# Patient Record
Sex: Male | Born: 1965 | Race: White | Hispanic: No | Marital: Married
Health system: Midwestern US, Academic
[De-identification: ages and names within clinical notes are randomized; demographics above are authoritative.]

---

## 2020-07-22 IMAGING — US ECHOCOMPL
1 series · 14 of 24 positions shown · non-contrast
Comparison: none

[Series 1: us echo 2d, complete · 98 acquisitions, 14 frames shown]
[im 1/98]
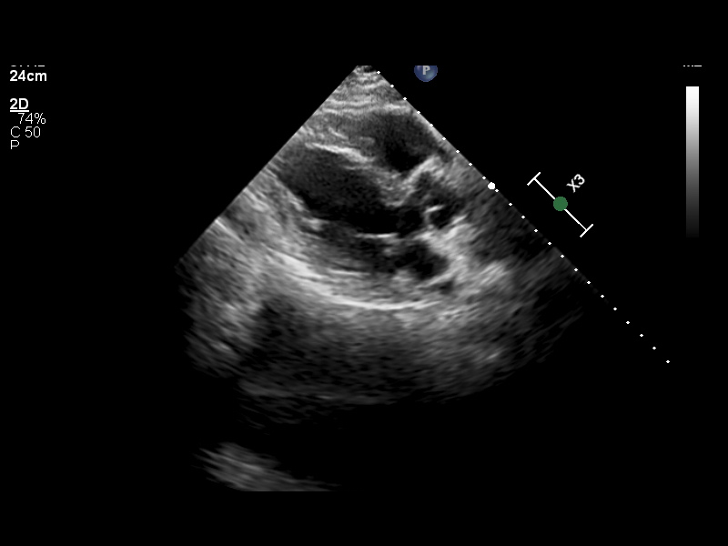
[im 9/98]
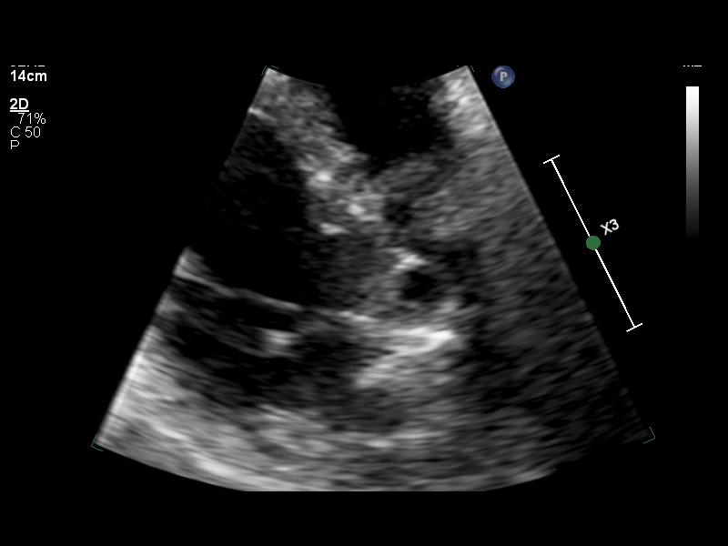
[im 13/98]
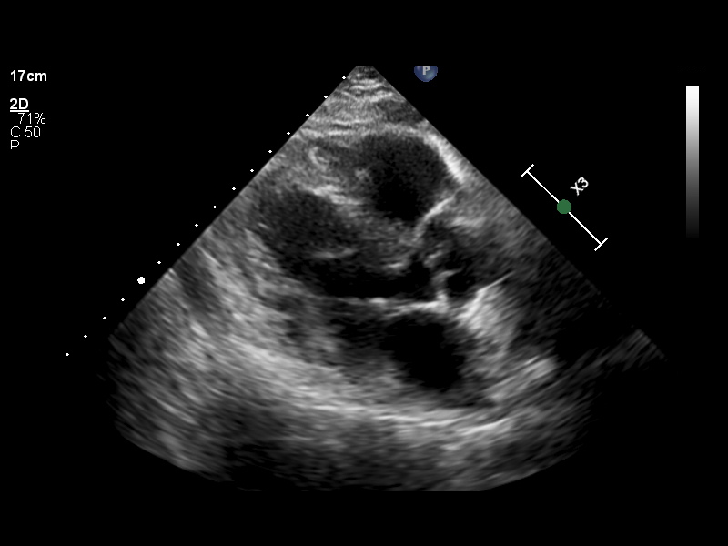
[im 22/98]
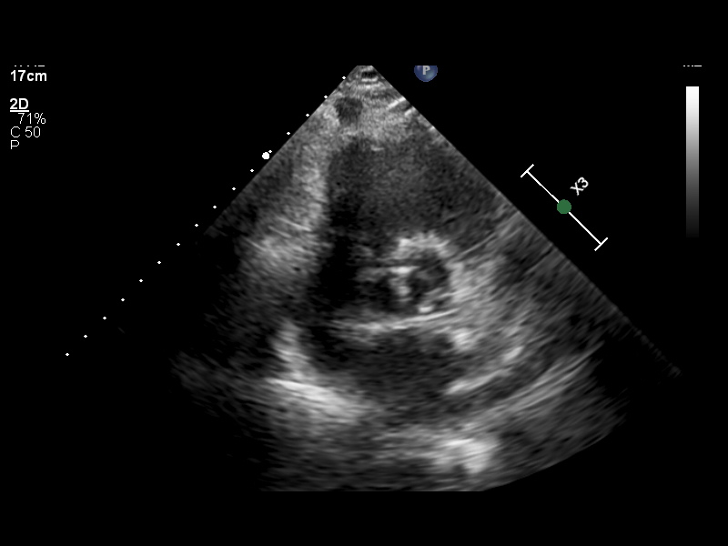
[im 26/98]
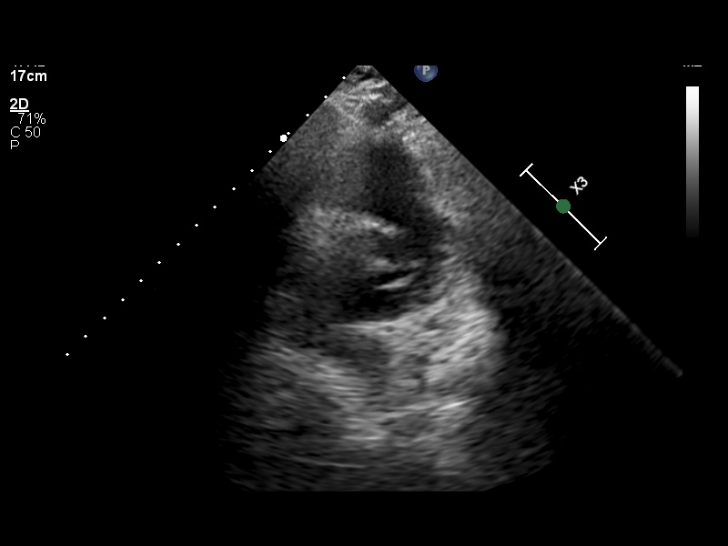
[im 34/98]
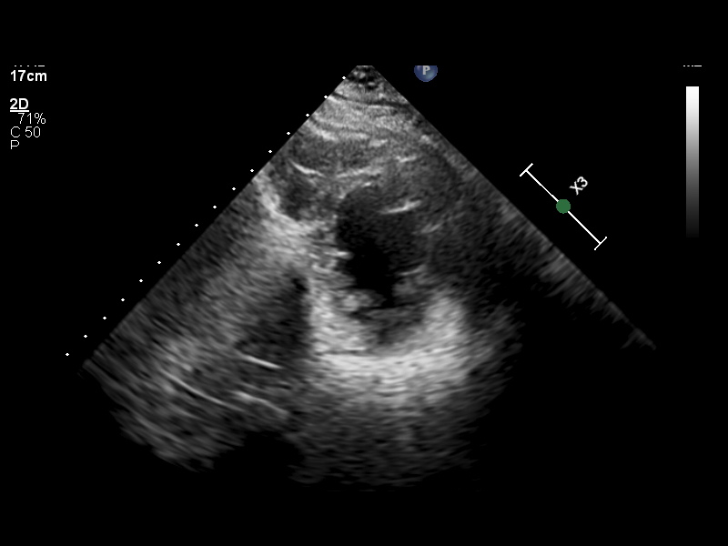
[im 43/98]
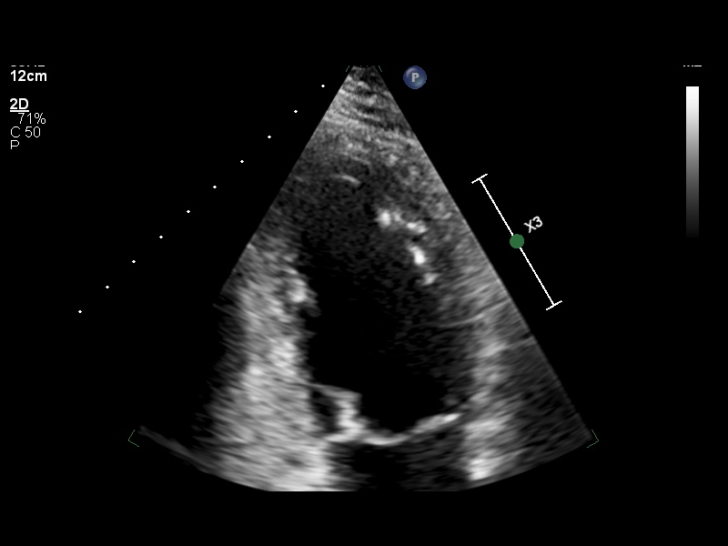
[im 43/98]
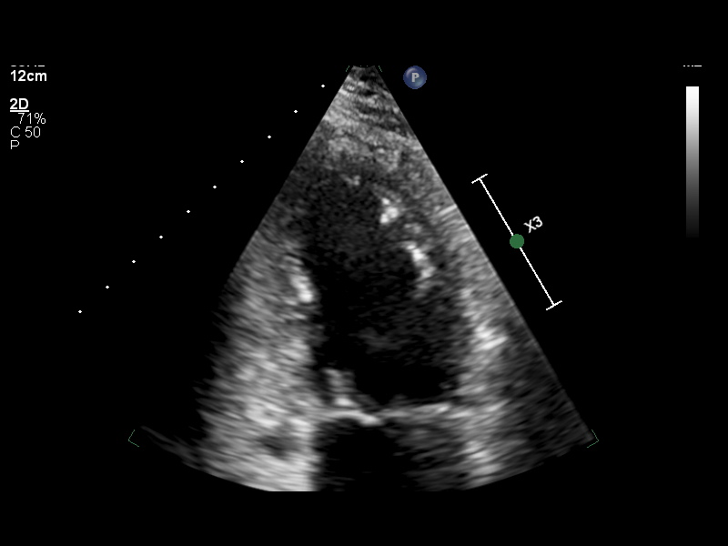
[im 60/98]
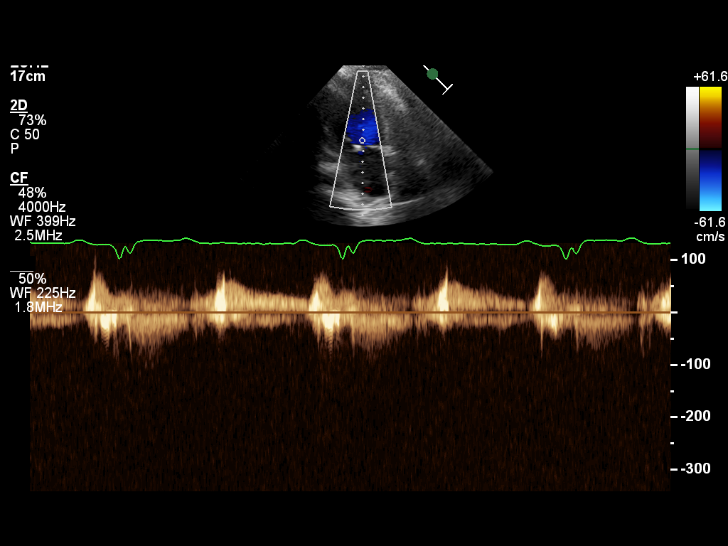
[im 72/98]
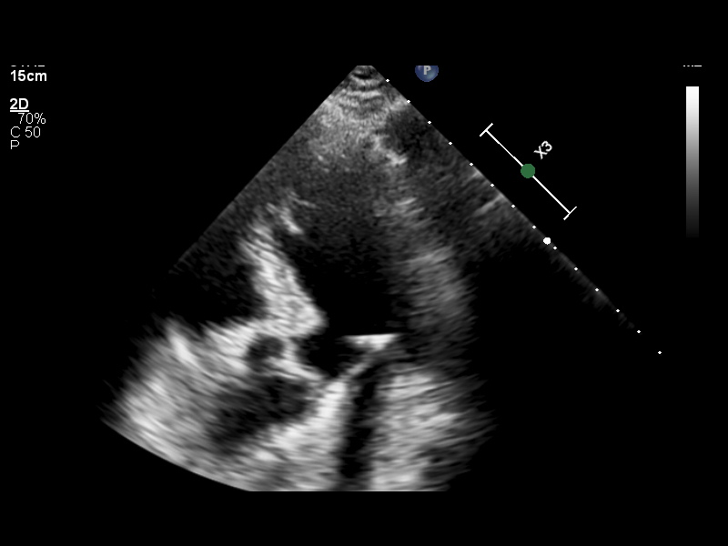
[im 81/98]
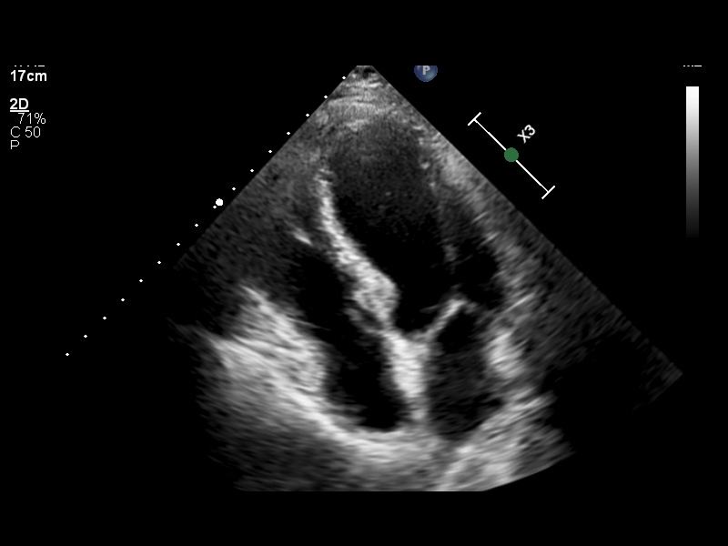
[im 85/98]
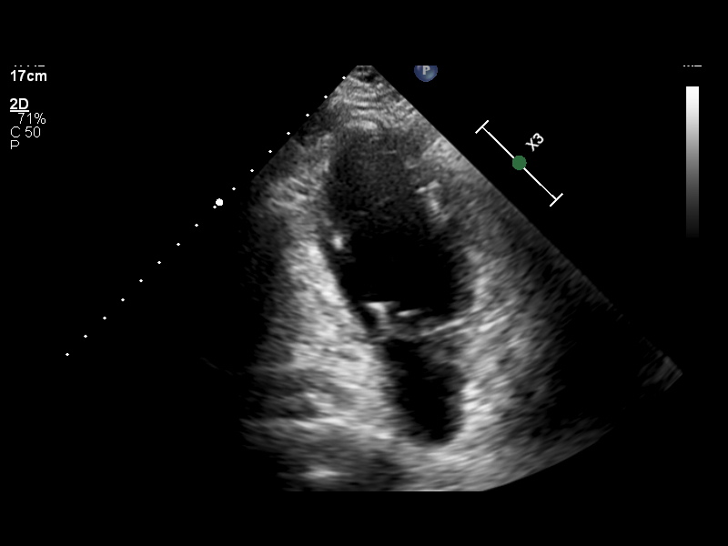
[im 89/98]
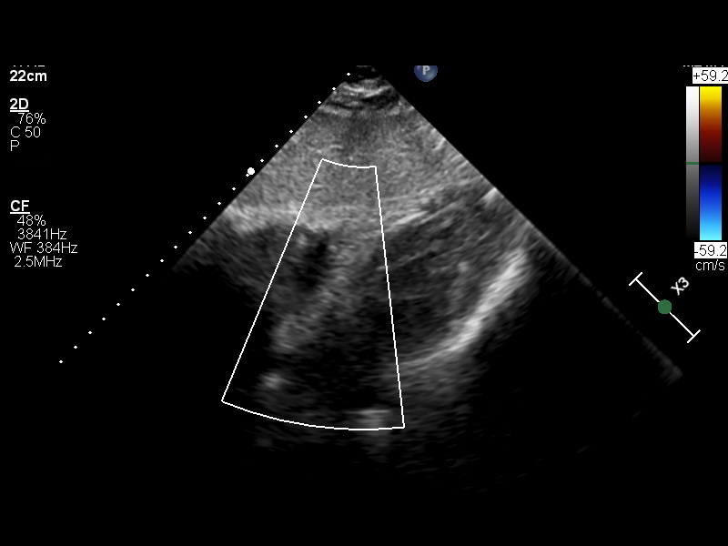
[im 98/98]
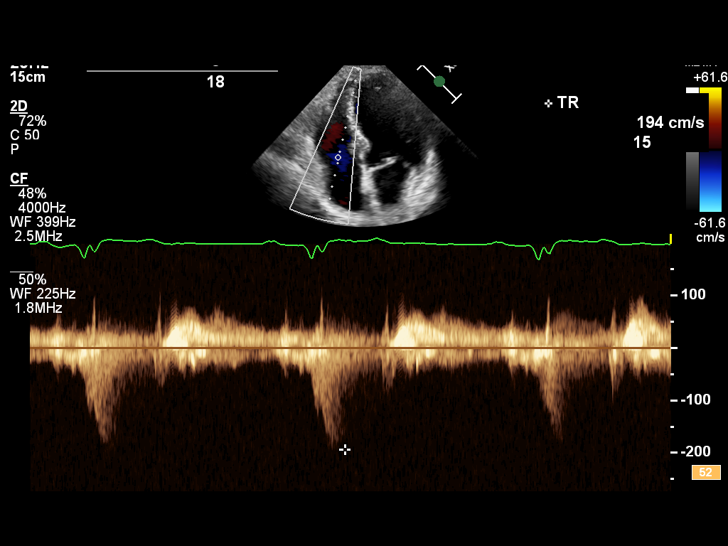

[14 of 24 positions shown; findings below may reference images not displayed]

FINAL REPORT IS SCANNED IN THE PATIENT'S EMR.

Tech Notes:

## 2021-01-11 ENCOUNTER — Encounter: Admit: 2021-01-11 | Discharge: 2021-01-11

## 2021-01-12 NOTE — Progress Notes
Patient Information:  55 yo male presents to ED following dog bit with injuries to forearm and hand. Dog known to patient.  Left FA full thickness laceration 10cm.  No arterial involvement.  Right hand full thickness laceration 8cm.  Muscle/tissue visible.   Left penna laceration 2 cm.    Reason for transfer:  No plastics, ortho or trauma    Imaging:  Right hand-WNL  Left forearm-WNL    EKG:  Right bundle/left fascicluar block    Labs:  Pending    Medications: 1 liter NS    Vitals:  Temp   HR 76  BP 102/50  RR 16  SpO2 99  FiO2 RA  Mental Status A/O x 4    Past Medical History: DM, CVA, CAD    1933 Update:    FA-mid dorsal aspect, baseline sensation, weak grip-pt endorses as weak    Cannot flex wrist d/t pain  Right had-injury across dorsum of hand, extending from mid metacarpal angles to MCP of 2nd finger    Palmar aspect-beneath MCP full thickness, across and down towards to thenar aspect    Punctures-PIP, DIP on second finger    ROM at baseline    Imaging okay.    2009 Update:    Per Dr Jimmye Norman:  With vascular intact would not perform any emergent intervention.  Recommendations:  Thorough (mulitple liters NS) washout of wounds  Close all lacerations/suture loosely, soft dry dressings  Do not close puncture wounds, soft dry dressings  If no PCN allergy, Augmentin for 10 days  Alternative if PCN allergy, Clindamycin   Follow up in clinic-clinic staff to call to set up appointment on Monday.  Clinic [phone]

## 2021-01-14 ENCOUNTER — Encounter: Admit: 2021-01-14 | Discharge: 2021-01-14 | Payer: MEDICARE

## 2021-01-15 ENCOUNTER — Encounter: Admit: 2021-01-15 | Discharge: 2021-01-15 | Payer: MEDICARE

## 2021-01-15 ENCOUNTER — Ambulatory Visit: Admit: 2021-01-15 | Discharge: 2021-01-16 | Payer: MEDICARE

## 2021-01-15 DIAGNOSIS — W540XXA Bitten by dog, initial encounter: Secondary | ICD-10-CM

## 2021-01-15 NOTE — Progress Notes
Subjective:       This is a very pleasant 55 year old gentleman who had a dog bite wounds to his right hand and left forearm on 3/19.  He was seen in the ED at [facility].  He is here for follow-up.    Brandon Conley is a 55 y.o. male.       Review of Systems   Constitutional: Negative.    HENT: Negative.    Eyes: Negative.    Respiratory: Negative.    Cardiovascular: Negative.    Gastrointestinal: Negative.    Endocrine: Negative.    Genitourinary: Negative.    Musculoskeletal: Negative.    Skin: Negative.    Allergic/Immunologic: Negative.    Neurological: Negative.    Hematological: Negative.    Psychiatric/Behavioral: Negative.          Objective:         No current outpatient medications on file.     There were no vitals filed for this visit.  There is no height or weight on file to calculate BMI.     Physical Exam  Physical Exam   Constitutional: Patient is oriented to person, place, and time. Patient appears well-developed and well-nourished.   HENT:   Head: Normocephalic and atraumatic.   Eyes: EOM are normal. Pupils are equal, round, and reactive to light.   Neck: Normal range of motion.   Cardiovascular: Normal rate, regular rhythm, normal heart sounds and intact distal pulses.    Pulmonary/Chest: Effort normal and breath sounds normal.   Neurological: Patient is alert and oriented to person, place, and time.   Skin: Skin is warm and dry. Capillary refill takes less than 2 seconds.   Psychiatric: Patient has a normal mood and affect. Behavior is normal. Judgment and thought content normal.     Dorsal forearm laceration noted, normal sensation in superficial radial nerve distribution, finger/thumb/wrist extensors work the same as preinjury, per patient    Right hand, multiple lacerations over dorsum and volar aspect, some range of motion with gentle work, sensation appears to be intact       Assessment and Plan:  Dog bite injury to left forearm and right hand  Referred to therapy to work on range of motion, follow-up next week

## 2021-01-22 ENCOUNTER — Encounter: Admit: 2021-01-22 | Discharge: 2021-01-22 | Payer: MEDICARE

## 2021-01-22 ENCOUNTER — Ambulatory Visit: Admit: 2021-01-22 | Discharge: 2021-01-23 | Payer: MEDICARE

## 2021-01-22 DIAGNOSIS — W540XXA Bitten by dog, initial encounter: Secondary | ICD-10-CM

## 2021-01-22 NOTE — Progress Notes
Subjective:       Here to follow-up.  Overall doing much better.  Notes that his right hand is still stiff, but he has not been to therapy yet.    Brandon Conley is a 55 y.o. male.       Review of Systems   Constitutional: Negative.    HENT: Negative.    Eyes: Negative.    Respiratory: Negative.    Cardiovascular: Negative.    Gastrointestinal: Negative.    Endocrine: Negative.    Genitourinary: Negative.    Musculoskeletal: Negative.    Skin: Negative.    Allergic/Immunologic: Negative.    Neurological: Negative.    Hematological: Negative.    Psychiatric/Behavioral: Negative.          Objective:         ? aspirin EC (ASPIR-81) 81 mg tablet Take 81 mg by mouth.   ? clopiDOGrel (PLAVIX) 75 mg tablet Take 75 mg by mouth daily.   ? ENTRESTO 24-26 mg tablet Take 1 tablet by mouth twice daily.   ? escitalopram oxalate (LEXAPRO) 10 mg tablet Take 10 mg by mouth daily.   ? furosemide (LASIX) 20 mg tablet Take 20 mg by mouth every morning.   ? HUMALOG U-100 INSULIN 100 unit/mL injection inject 20 UNITS SUBCUTANEOUSLY DAILY   ? HYDROcodone/acetaminophen (NORCO) 5/325 mg tablet Take 1 tablet by mouth.   ? isosorbide mononitrate ER (IMDUR) 30 mg tablet Take 30 mg by mouth daily.   ? LANTUS U-100 INSULIN 100 unit/mL vial inject 30 UNITS SUBCUTANEOUSLY EVERY EVENING   ? metoprolol XL (TOPROL XL) 25 mg extended release tablet Take 25 mg by mouth daily.   ? simvastatin (ZOCOR) 40 mg tablet   90 EA, TAKE ONE TABLET BY MOUTH EVERY EVENING, 0 Number of Refills     There were no vitals filed for this visit.  There is no height or weight on file to calculate BMI.     Physical Exam  Right hand, multiple lacerations with some scabbing, suture starting to fall out.  Limited range of motion appears to be related to a fair bit of dorsal hand swelling.    Left forearm, laceration healing well, sutures starting to fall out       Assessment and Plan:  Bilateral upper extremity dog bites  Sutures removed, patient given instructions to follow-up with therapy.  Follow-up with me in 4 to 6 weeks.

## 2021-01-23 ENCOUNTER — Encounter: Admit: 2021-01-23 | Discharge: 2021-01-23 | Payer: MEDICARE

## 2021-01-23 NOTE — Telephone Encounter
Patient called regarding need for referral for CHT to be faxed to [facility] physical therapy at [phone].    Order printed and faxed.

## 2021-02-26 ENCOUNTER — Ambulatory Visit: Admit: 2021-02-26 | Discharge: 2021-02-27 | Payer: MEDICARE

## 2021-02-26 ENCOUNTER — Encounter: Admit: 2021-02-26 | Discharge: 2021-02-26 | Payer: MEDICARE

## 2021-02-26 DIAGNOSIS — W540XXD Bitten by dog, subsequent encounter: Secondary | ICD-10-CM

## 2021-02-26 NOTE — Progress Notes
Subjective:       History of Present Illness  Brandon Conley is a 55 y.o. male who returns to clinic regarding his right hand. He is still complaining IF/LF stiffness, largely at the MP joint. He goes to hand therapy twice per week. He tried compression but didn't think it was effective and has since stopped.        Review of Systems   Constitutional: Negative.    HENT: Negative.    Eyes: Negative.    Respiratory: Negative.    Cardiovascular: Negative.    Gastrointestinal: Negative.    Endocrine: Negative.    Genitourinary: Negative.    Musculoskeletal: Negative.    Skin: Negative.    Allergic/Immunologic: Negative.    Neurological: Negative.    Hematological: Negative.    Psychiatric/Behavioral: Negative.          Objective:         ? aspirin EC (ASPIR-81) 81 mg tablet Take 81 mg by mouth.   ? clopiDOGrel (PLAVIX) 75 mg tablet Take 75 mg by mouth daily.   ? ENTRESTO 24-26 mg tablet Take 1 tablet by mouth twice daily.   ? escitalopram oxalate (LEXAPRO) 10 mg tablet Take 10 mg by mouth daily.   ? furosemide (LASIX) 20 mg tablet Take 20 mg by mouth every morning.   ? HUMALOG U-100 INSULIN 100 unit/mL injection inject 20 UNITS SUBCUTANEOUSLY DAILY   ? isosorbide mononitrate ER (IMDUR) 30 mg tablet Take 30 mg by mouth daily.   ? LANTUS U-100 INSULIN 100 unit/mL vial inject 30 UNITS SUBCUTANEOUSLY EVERY EVENING   ? metoprolol XL (TOPROL XL) 25 mg extended release tablet Take 25 mg by mouth daily.   ? simvastatin (ZOCOR) 40 mg tablet   90 EA, TAKE ONE TABLET BY MOUTH EVERY EVENING, 0 Number of Refills     Vitals:    02/26/21 0929   BP: 131/55   BP Source: Arm, Right Upper   Pulse: 72   Temp: 36.7 ?C (98 ?F)   TempSrc: Skin   PainSc: Four   Weight: 76.2 kg (168 lb)   Height: 177.8 cm (5' 10)     Body mass index is 24.11 kg/m?Marland Kitchen     Physical Exam  Stiffness of R IF and LF, more so w/ LF. Continues to have swelling around MP joints. Puncture wounds are largely healed. No drainage. IP joints appear supple.       Assessment and Plan:  Appears to be making progress. Continue with therapy and home exercises. RTC 6 weeks.

## 2021-04-02 ENCOUNTER — Encounter: Admit: 2021-04-02 | Discharge: 2021-04-02 | Payer: MEDICARE

## 2021-04-02 ENCOUNTER — Ambulatory Visit: Admit: 2021-04-02 | Discharge: 2021-04-03 | Payer: MEDICARE

## 2021-04-02 DIAGNOSIS — W540XXD Bitten by dog, subsequent encounter: Secondary | ICD-10-CM

## 2021-04-02 NOTE — Progress Notes
Subjective:       Brandon Conley is here to follow-up.  Overall he is doing well, but he is pretty frustrated by continued swelling and difficulty getting range of motion especially in his right middle finger    Brandon Conley is a 55 y.o. male.       Review of Systems   Constitutional: Negative.    HENT: Negative.    Eyes: Negative.    Respiratory: Negative.    Cardiovascular: Negative.    Gastrointestinal: Negative.    Endocrine: Negative.    Genitourinary: Negative.    Musculoskeletal: Negative.    Skin: Negative.    Allergic/Immunologic: Negative.    Neurological: Negative.    Hematological: Negative.    Psychiatric/Behavioral: Negative.          Objective:         ? aspirin EC 81 mg tablet Take 81 mg by mouth.   ? clopiDOGrel (PLAVIX) 75 mg tablet Take 75 mg by mouth daily.   ? ENTRESTO 24-26 mg tablet Take 1 tablet by mouth twice daily.   ? escitalopram oxalate (LEXAPRO) 10 mg tablet Take 10 mg by mouth daily.   ? furosemide (LASIX) 20 mg tablet Take 20 mg by mouth every morning.   ? HUMALOG U-100 INSULIN 100 unit/mL injection inject 20 UNITS SUBCUTANEOUSLY DAILY   ? isosorbide mononitrate ER (IMDUR) 30 mg tablet Take 30 mg by mouth daily.   ? LANTUS U-100 INSULIN 100 unit/mL vial inject 30 UNITS SUBCUTANEOUSLY EVERY EVENING   ? metoprolol XL (TOPROL XL) 25 mg extended release tablet Take 25 mg by mouth daily.   ? simvastatin (ZOCOR) 40 mg tablet   90 EA, TAKE ONE TABLET BY MOUTH EVERY EVENING, 0 Number of Refills     Vitals:    04/02/21 0949   BP: 123/54   Pulse: 59   PainSc: Zero   Weight: 77.5 kg (170 lb 12.8 oz)   Height: 177.8 cm (5' 10)     Body mass index is 24.51 kg/m?Marland Kitchen     Physical Exam  Right hand still with some swelling, active range of motion improving, middle finger still fairly stiff       Assessment and Plan:  Right hand with swelling and stiffness after dog bite injury  Reassured patient that swelling and stiffness at this point are still fairly normal.  Keep working in therapy on active and passive range of motion.  Follow-up in about 2 months.  Might require tenolysis/joint capsulotomy at some point, but hopefully we can avoid that.

## 2021-06-04 ENCOUNTER — Encounter: Admit: 2021-06-04 | Discharge: 2021-06-04 | Payer: MEDICARE

## 2021-06-04 ENCOUNTER — Ambulatory Visit: Admit: 2021-06-04 | Discharge: 2021-06-04 | Payer: MEDICARE

## 2021-06-04 DIAGNOSIS — W540XXD Bitten by dog, subsequent encounter: Secondary | ICD-10-CM

## 2021-06-04 NOTE — Progress Notes
Subjective:       Here to follow-up.  Overall doing well.  Thinks that he is reached his baseline.    Brandon Conley is a 55 y.o. male.       Review of Systems   Constitutional: Negative.    HENT: Negative.    Eyes: Negative.    Respiratory: Negative.    Cardiovascular: Negative.    Gastrointestinal: Negative.    Endocrine: Negative.    Genitourinary: Negative.    Musculoskeletal: Positive for back pain.   Skin: Negative.    Allergic/Immunologic: Negative.    Neurological: Negative.    Hematological: Negative.    Psychiatric/Behavioral: Negative.          Objective:         ? aspirin EC 81 mg tablet Take 81 mg by mouth.   ? clopiDOGrel (PLAVIX) 75 mg tablet Take 75 mg by mouth daily.   ? ENTRESTO 24-26 mg tablet Take 1 tablet by mouth twice daily.   ? escitalopram oxalate (LEXAPRO) 10 mg tablet Take 10 mg by mouth daily.   ? furosemide (LASIX) 20 mg tablet Take 20 mg by mouth every morning.   ? HUMALOG U-100 INSULIN 100 unit/mL injection inject 20 UNITS SUBCUTANEOUSLY DAILY   ? isosorbide mononitrate ER (IMDUR) 30 mg tablet Take 30 mg by mouth daily.   ? LANTUS U-100 INSULIN 100 unit/mL vial inject 30 UNITS SUBCUTANEOUSLY EVERY EVENING   ? metoprolol XL (TOPROL XL) 25 mg extended release tablet Take 25 mg by mouth daily.   ? rOPINIRole (REQUIP) 1 mg tablet TAKE ONE TABLET BY MOUTH 1-3 hours before bedtime   ? simvastatin (ZOCOR) 40 mg tablet   90 EA, TAKE ONE TABLET BY MOUTH EVERY EVENING, 0 Number of Refills     Vitals:    06/04/21 0941   BP: 104/69   Pulse: 60   PainSc: Zero   Weight: 79.4 kg (175 lb)   Height: 177.8 cm (5' 10)     Body mass index is 25.11 kg/m?Marland Kitchen     Physical Exam  Right middle finger with 30 degree flexion contracture, fair bit of stiffness at PIP joint, about 40 degrees of total arc       Assessment and Plan:  Right hand stiffness after dog bite injury  Today I told the patient that if he wants to improve upon things it would essentially be at flexor tenolysis and a volar plate release.  At this point he is not sure that he wants anything further like that.  I told him that if he wants to discuss it more we are happy to see him anytime.    Follow-up as needed.

## 2021-12-25 IMAGING — CR HIPPELBI
3 series · 3 of 3 positions shown · non-contrast
Comparison: none

[hip ap pelvis]
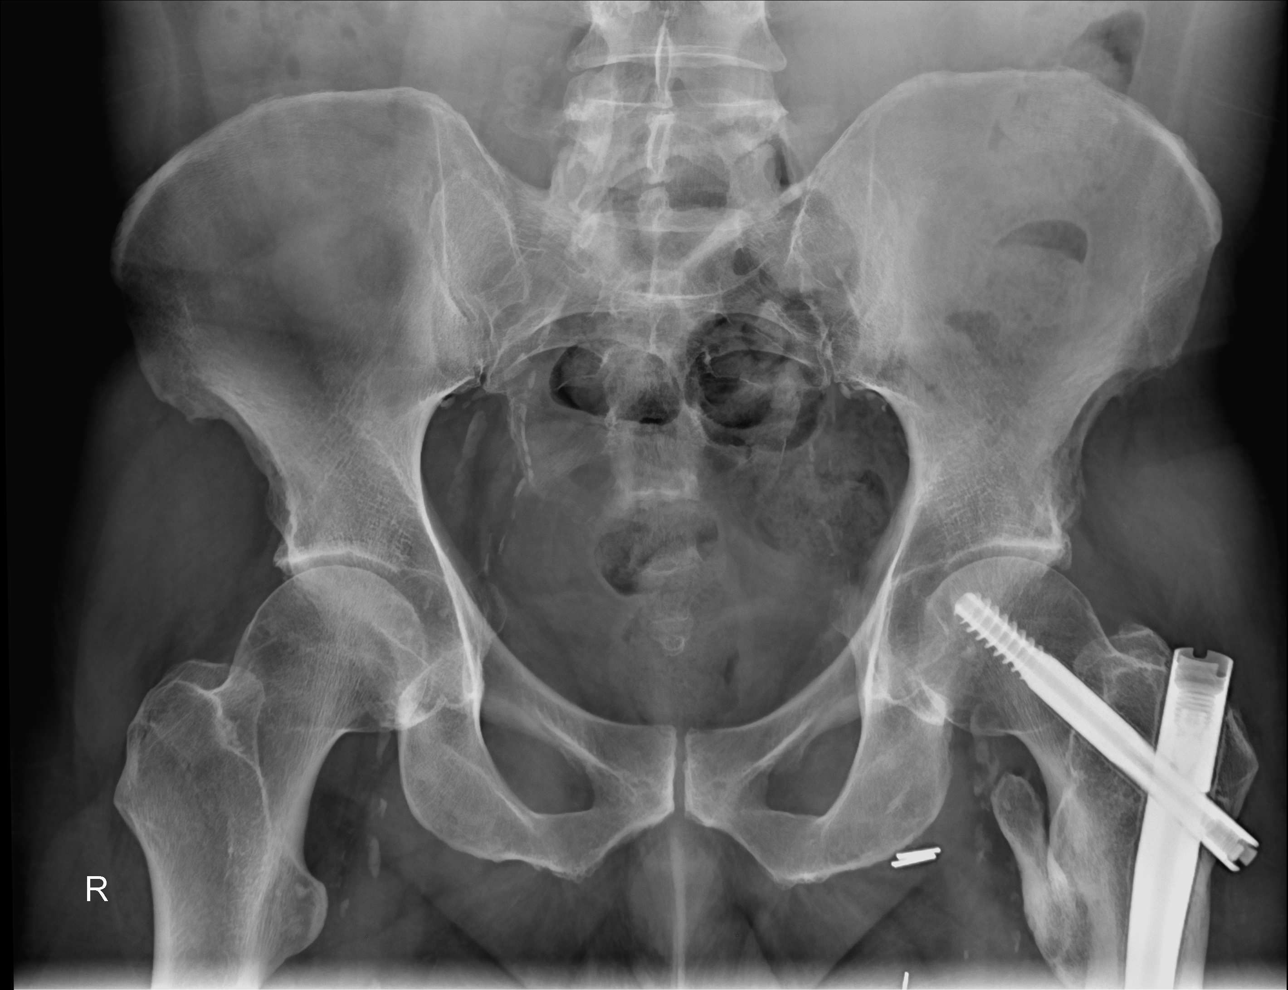

[hip frog lat (1 of 2)]
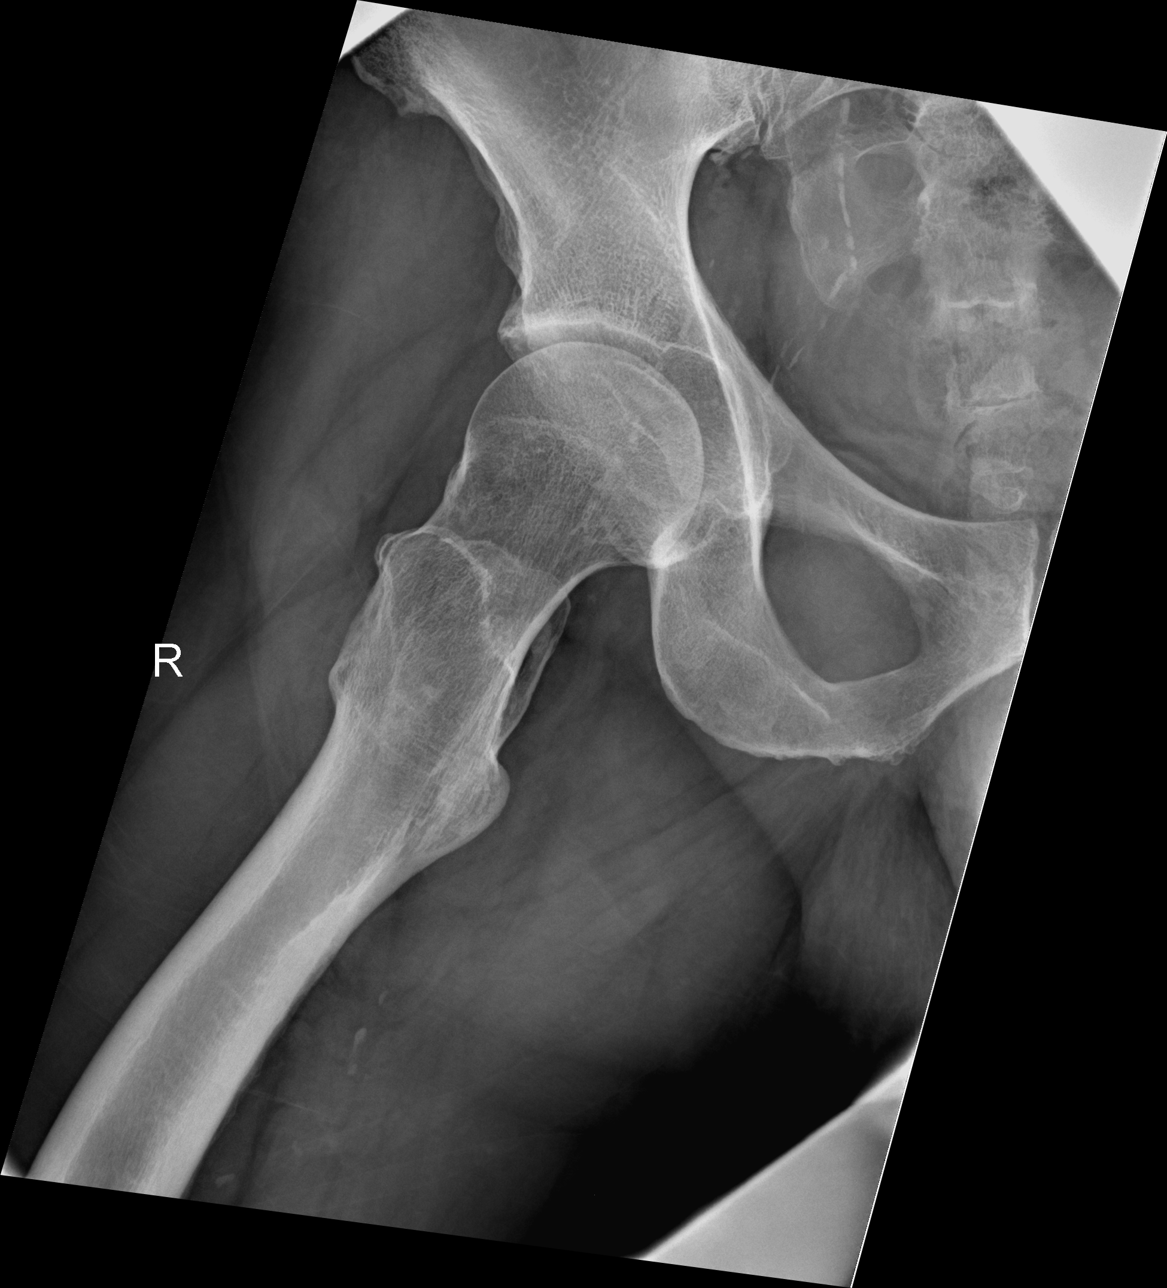

[hip frog lat (2 of 2)]
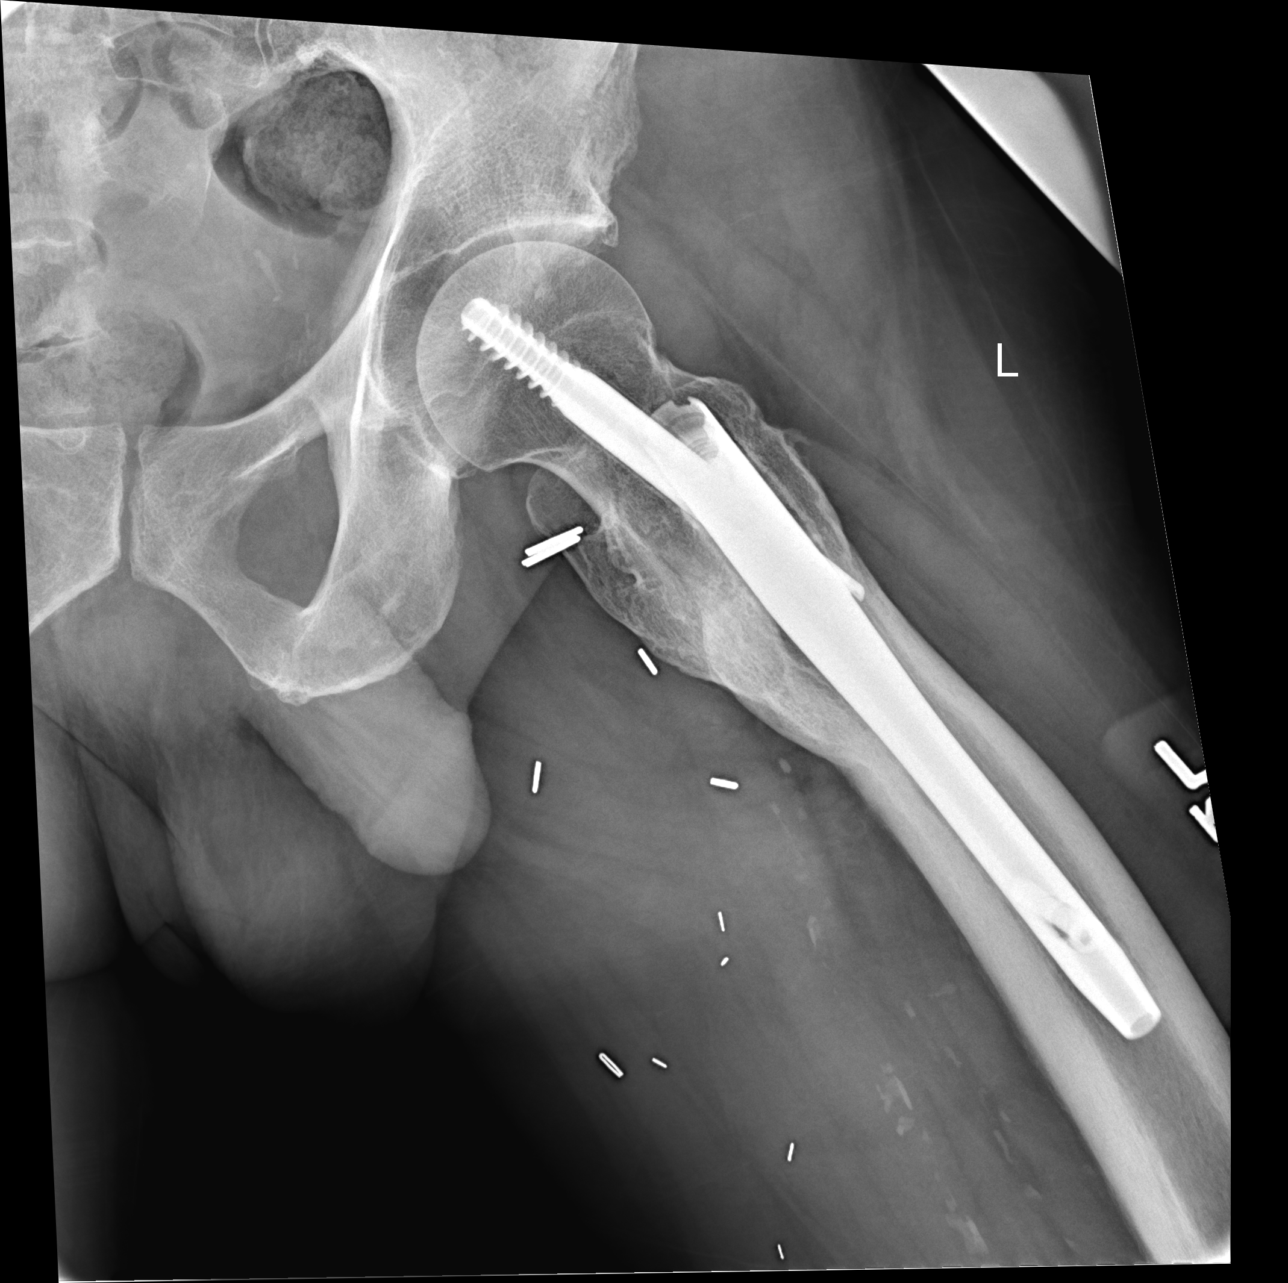

[3 of 3 positions shown; findings below may reference images not displayed]

DIAGNOSTIC STUDIES

EXAM

XR hip BI w PDEXX

INDICATION

bilateral pain, gain limitation, left hip hardware
bilateral hip pain, hx of left hip ORIF, hx of coronary bipass surgery, no trauma to rt hip, no
injection hx

TECHNIQUE

Two views of the left hip, two views of the right hip, including pelvis

COMPARISONS

None

FINDINGS

Left hip intramedullary nail with interlocking screw fixation. Fracture is healed. No evidence of
hardware complication. There is heterotopic ossification along the lesser trochanter. Mild bilateral
hip osteoarthritis. No acute fracture or dislocation in the pelvis. Normal osseous mineralization.
Vascular calcifications.

IMPRESSION

Left hip hardware and heterotopic ossification. No acute findings of the bilateral hip/pelvis. Mild
bilateral hip osteoarthritis.

Tech Notes:

bilateral hip pain, hx of left hip ORIF, hx of coronary bipass surgery, no trauma to rt hip, no
injection hx

## 2022-01-07 IMAGING — CR [ID]
1 series · 1 of 1 positions shown · non-contrast
Comparison: none

[x hand lat left]
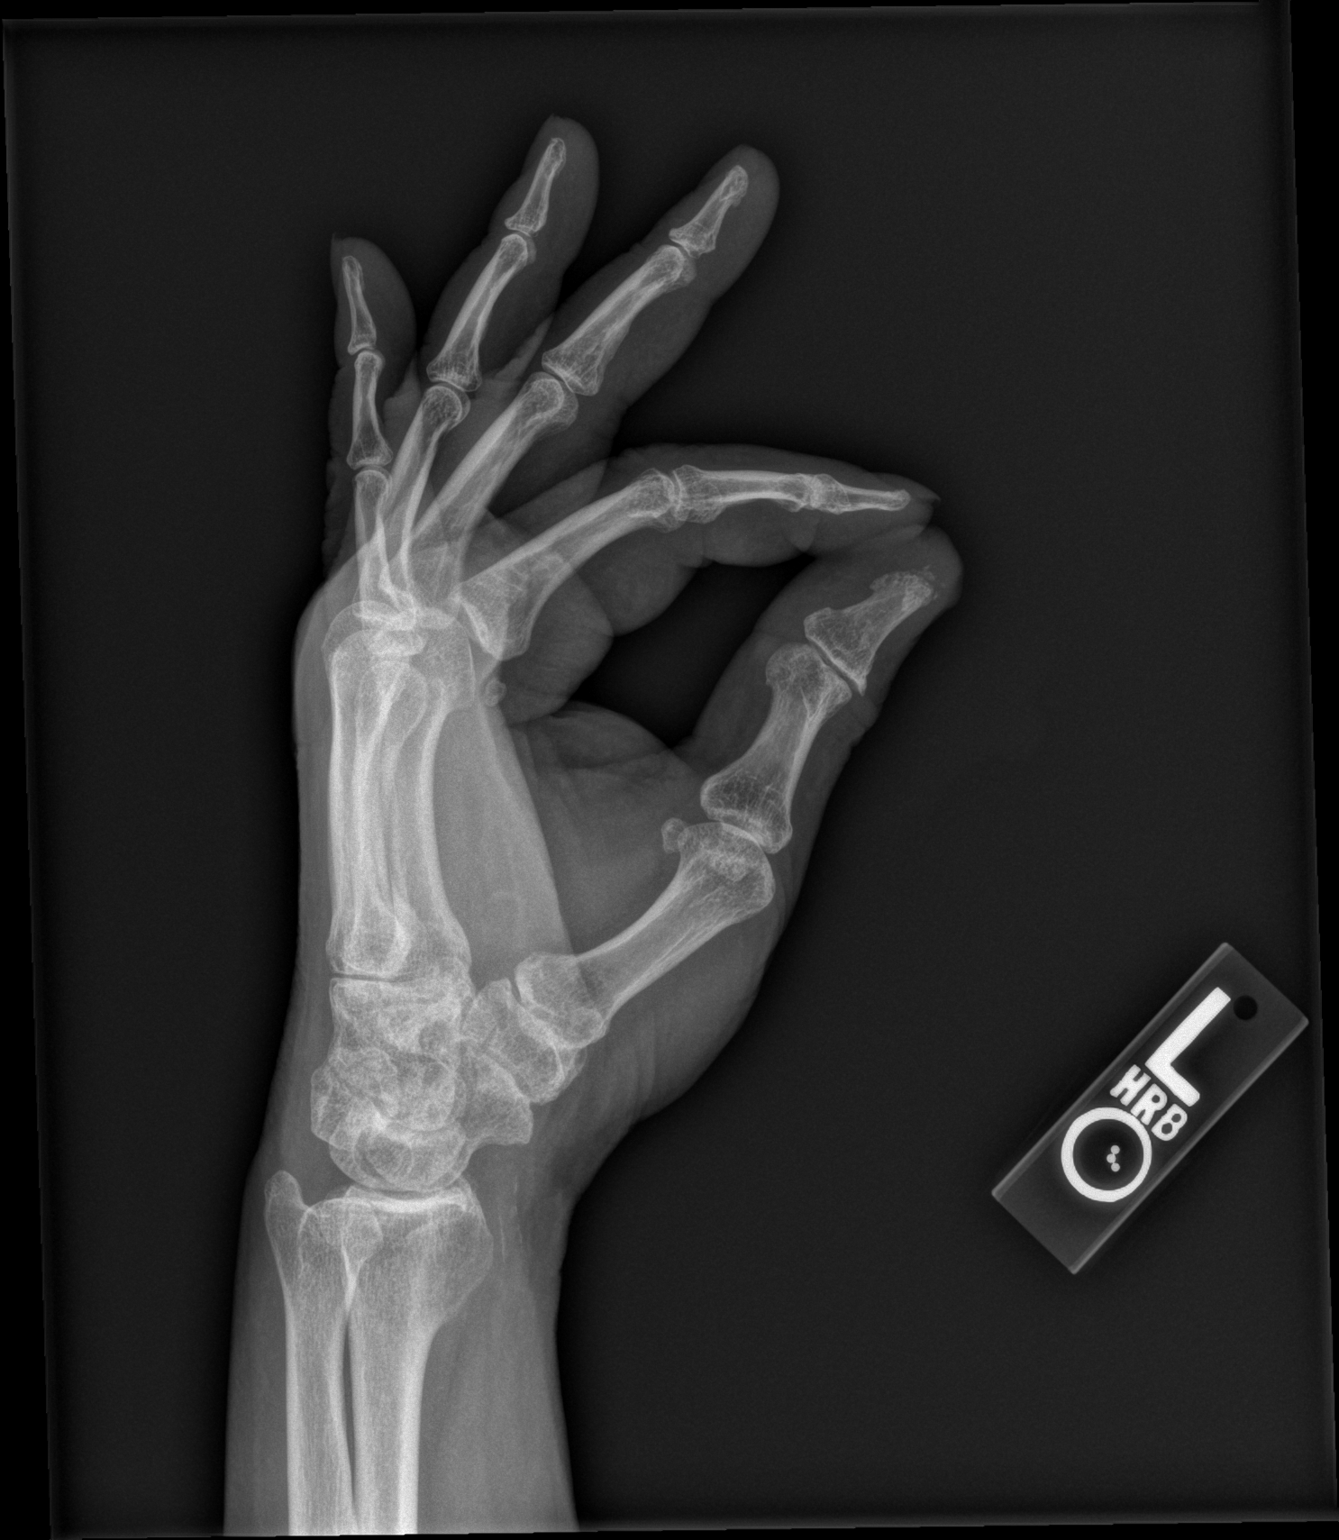

[1 of 1 positions shown; findings below may reference images not displayed]

DIAGNOSTIC STUDIES

EXAM

XR hand LT min 3V

INDICATION

Left thumb laceration wtih table saw
chain saw accident . left thumb injury

TECHNIQUE

Three views

COMPARISONS

None

FINDINGS

There is early joint space narrowing involving the 1st metacarpophalangeal and 1st interphalangeal
joints. There is early joint space narrowing involving the DIP joints. There is diffuse osteopenia.
No dislocation. There is an acute fracture involving the tuft of the 1st distal phalanx.

IMPRESSION

Acute fracture involving the tuft of the 1st distal phalanx.

Tech Notes:

chain saw accident . left thumb injury

## 2022-04-29 IMAGING — CR HIPCMLT
3 series · 3 of 3 positions shown · non-contrast
Comparison: none

[hip ap pelvis]
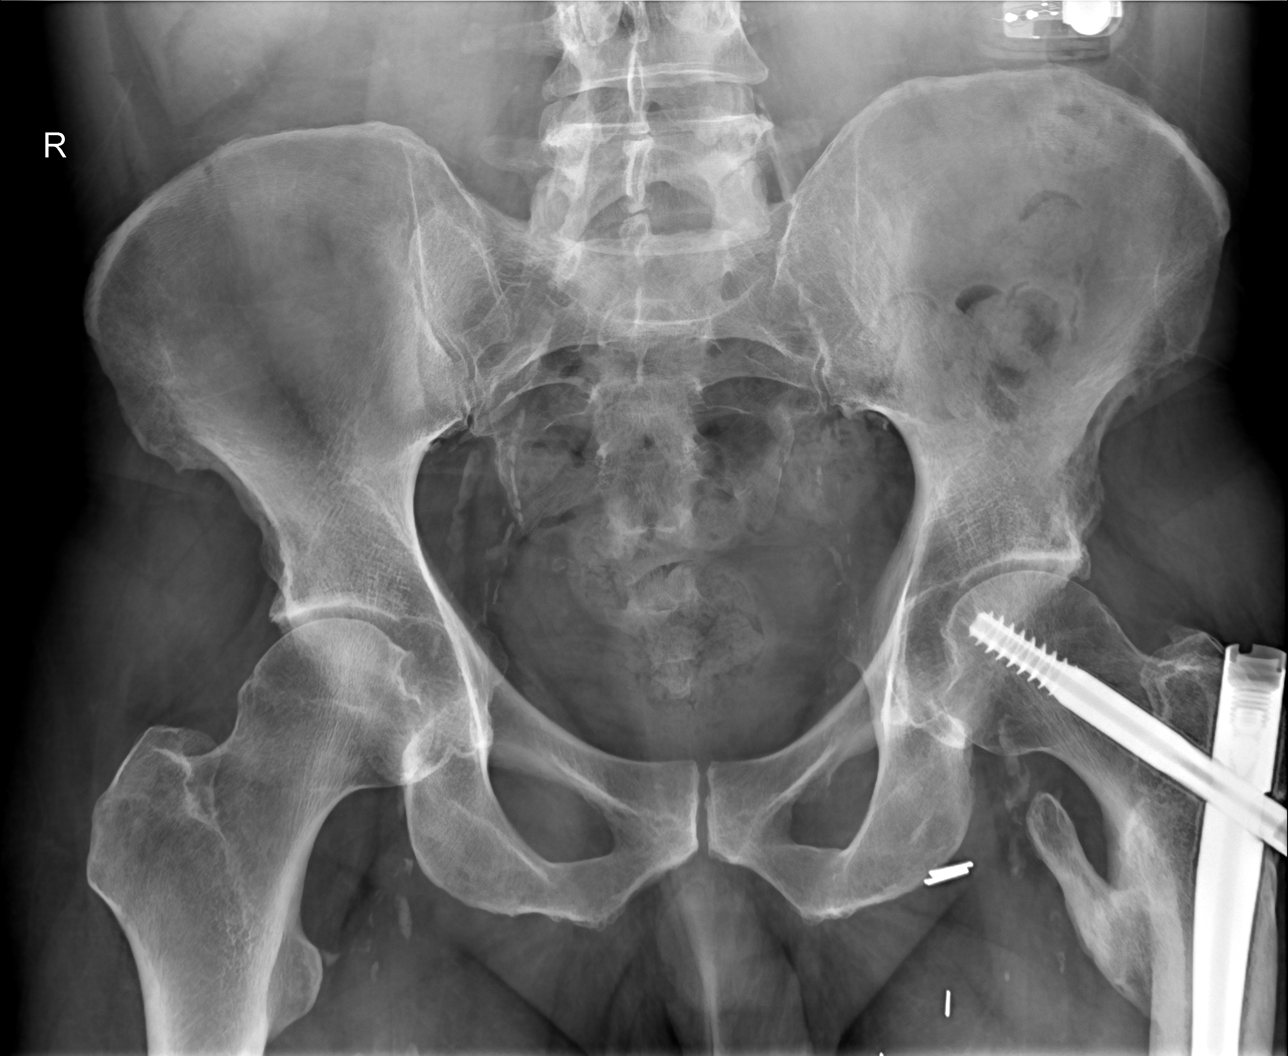

[hip ap]
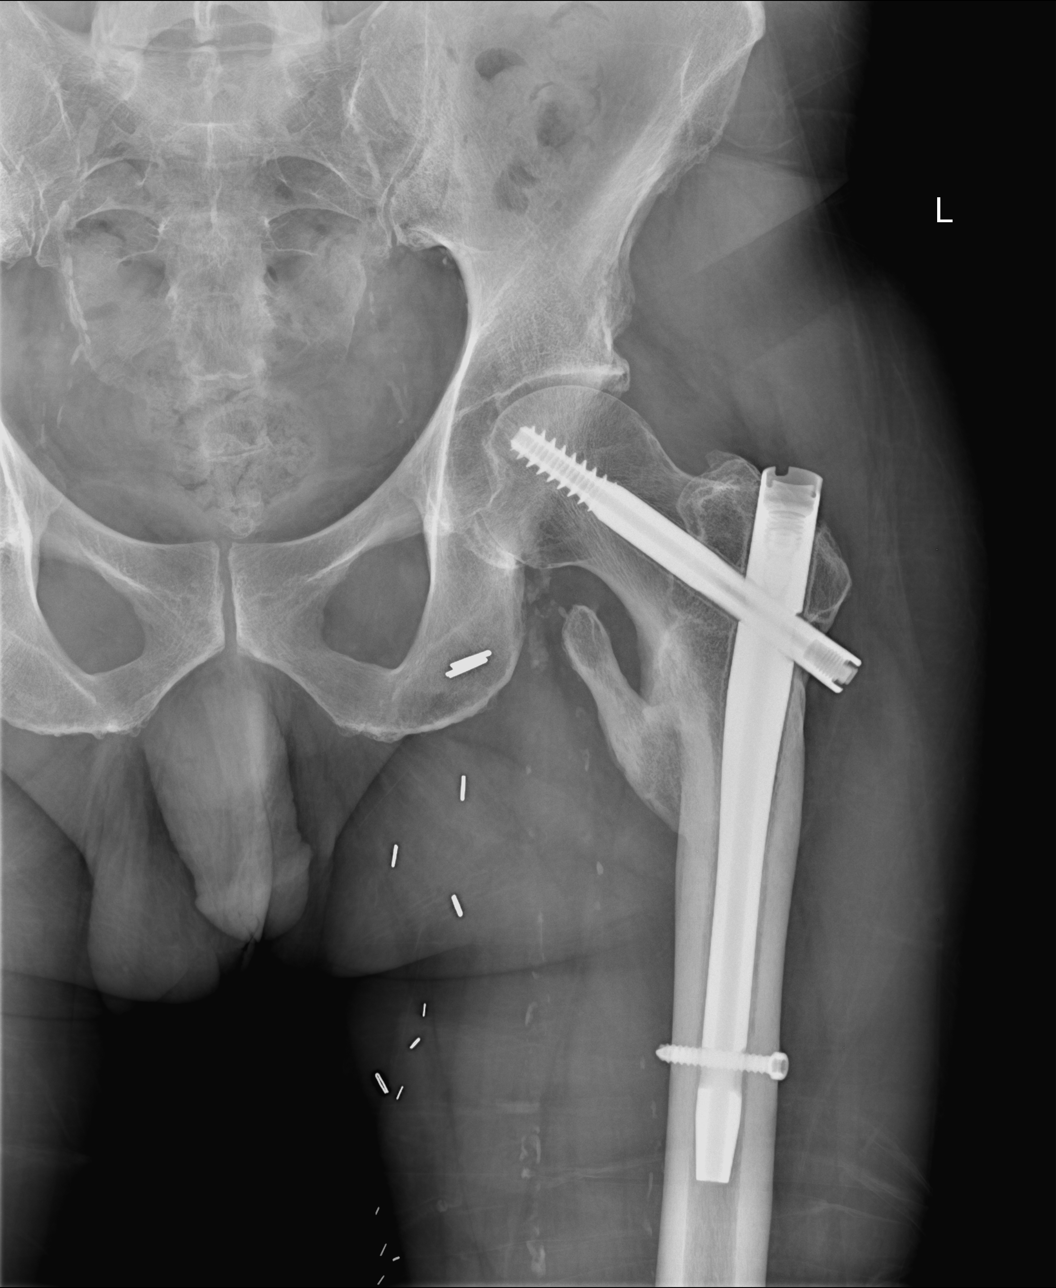

[hip frog lat]
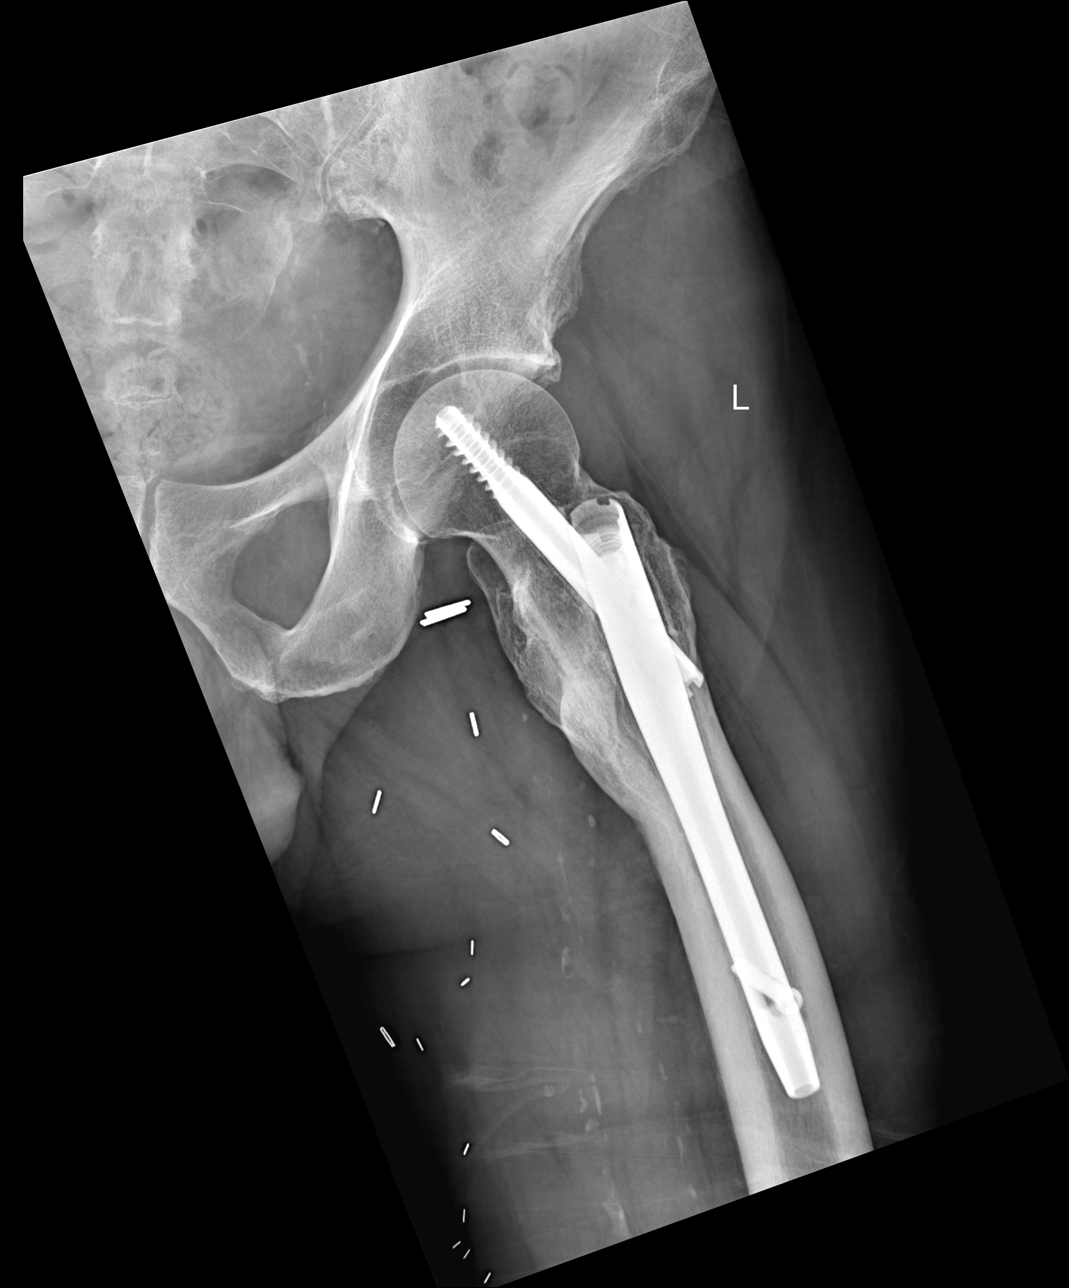

[3 of 3 positions shown; findings below may reference images not displayed]

DIAGNOSTIC STUDIES

EXAM

XR hip LT, 2-3V w or wo pelvis

INDICATION

low back pain and left hip pain
C/O WORSENING LOW BACK PAIN AND POSTERIOR PELVIS PAIN X 4 MONTHS. NO RECENT INJURY. REPORTS FREQUENT
FALLS DUE TO RESIDLUAL LT SIDE WEAKNESS FROM CVA.   HB

TECHNIQUE

Two views of the left hip including pelvis

COMPARISONS

December 25, 2021

FINDINGS

Left femoral intramedullary nail and interlocking screw fixation without evidence of hardware
complication. Fracture is healed. Heterotopic ossification along the lesser trochanter is re-
demonstrated. Mild bilateral hip osteoarthritis. Left groin surgical clips. Vascular calcifications.

IMPRESSION

Similar left femoral hardware and heterotopic ossification. Mild bilateral hip osteoarthritis.

Tech Notes:

C/O WORSENING LOW BACK PAIN AND POSTERIOR PELVIS PAIN X 4 MONTHS. NO RECENT INJURY. REPORTS FREQUENT
FALLS DUE TO RESIDLUAL LT SIDE WEAKNESS FROM CVA.
HB

## 2022-06-04 IMAGING — MR BRAINWO
9 of 13 series · 28 of 48 positions shown · non-contrast
Comparison: none

[Series 5: T1 · sagittal · 5.0mm · 0.80mm/px · 3 of 27 slices shown (1 of 2)]
[im 1/27]
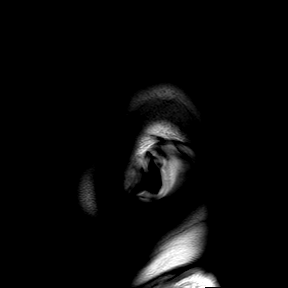
[im 14/27]
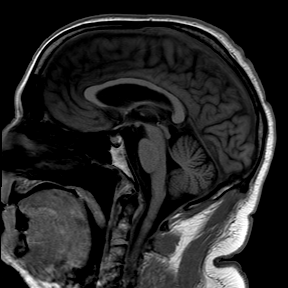
[im 27/27]
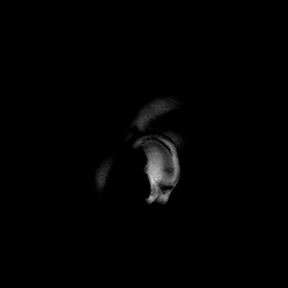

[Series 6: DWI · axial · 5.0mm · 1.44mm/px · z∈[-97,+59]mm · 3 of 27 slices shown (1 of 3)]
[im 1/27]
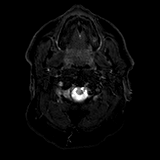
[im 14/27]
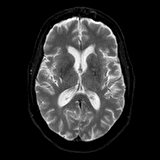
[im 27/27]
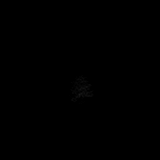

[Series 6: DWI · axial · 5.0mm · 1.44mm/px · z∈[-97,+59]mm · 3 of 27 slices shown (2 of 3)]
[im 1/27]
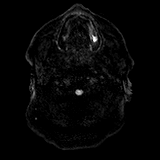
[im 14/27]
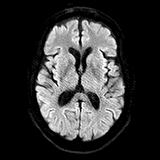
[im 27/27]
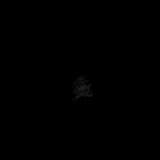

[Series 7: DWI · axial · 5.0mm · 1.44mm/px · z∈[-97,+53]mm · 3 of 26 slices shown (3 of 3)]
[im 1/26]
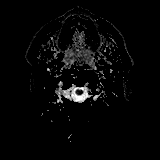
[im 13/26]
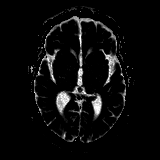
[im 26/26]
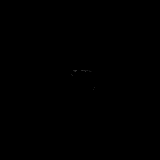

[Series 8: FLAIR · axial · 5.0mm · 0.72mm/px · z∈[-96,+59]mm · 3 of 27 slices shown]
[im 1/27]
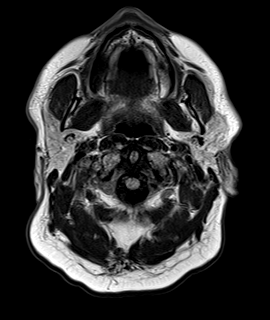
[im 14/27]
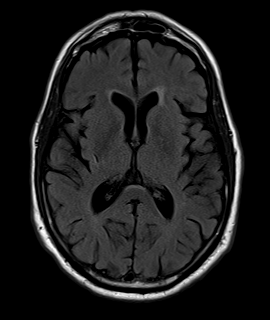
[im 27/27]
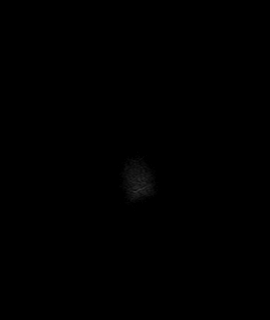

[Series 9: T2 · axial · 5.0mm · 0.45mm/px · z∈[-96,+59]mm · 3 of 27 slices shown (1 of 2)]
[im 1/27]
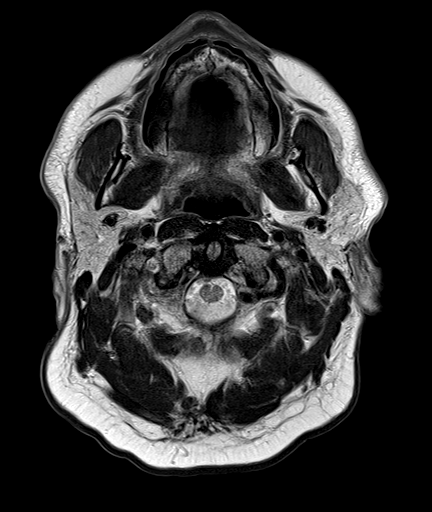
[im 14/27]
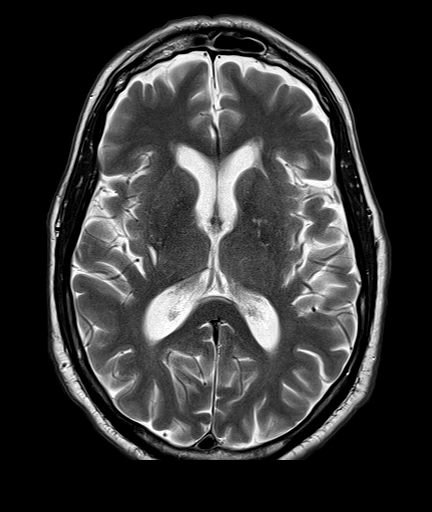
[im 27/27]
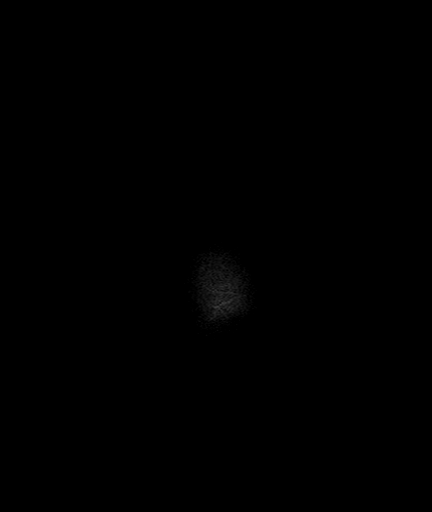

[Series 10: T1 · axial · 5.0mm · 0.72mm/px · z∈[-96,+59]mm · 3 of 27 slices shown (2 of 2)]
[im 1/27]
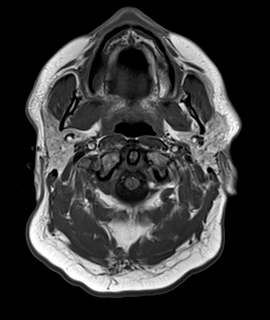
[im 14/27]
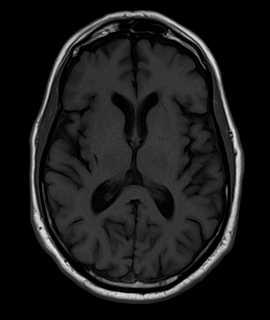
[im 27/27]
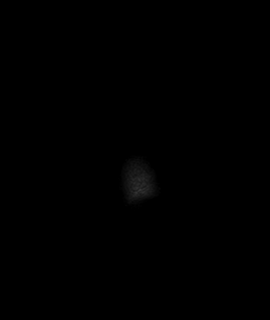

[Series 11: GRE · axial · 5.0mm · 0.45mm/px · z∈[-96,+59]mm · 3 of 27 slices shown]
[im 1/27]
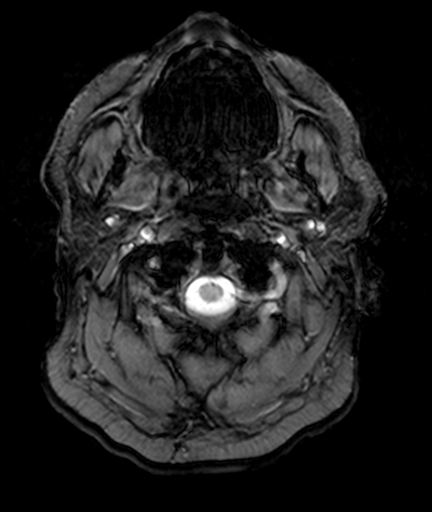
[im 14/27]
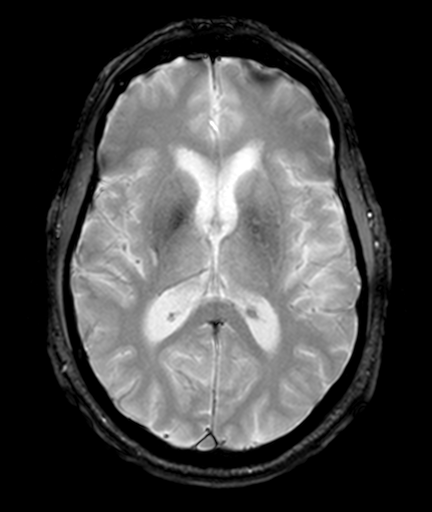
[im 27/27]
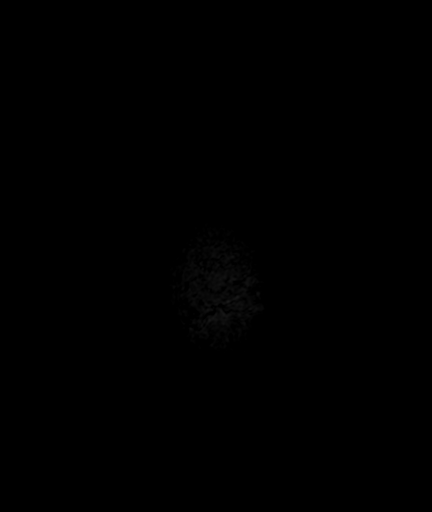

[Series 12: T2 · coronal · 4.0mm · 0.72mm/px · 4 of 35 slices shown (2 of 2)]
[im 1/35]
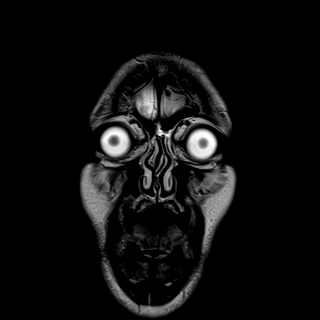
[im 12/35]
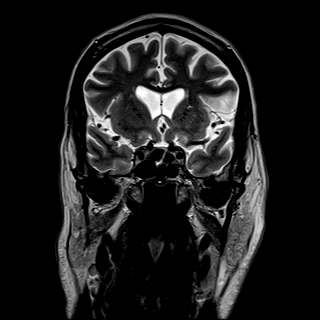
[im 23/35]
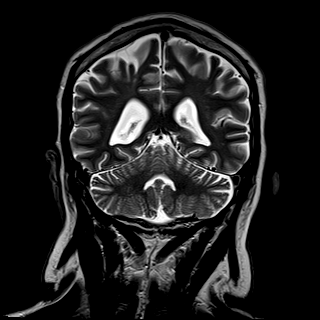
[im 35/35]
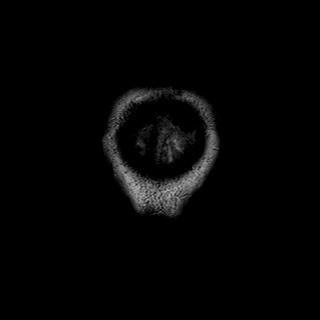

[28 of 48 positions shown; findings below may reference images not displayed]

DIAGNOSTIC STUDIES

EXAM

MRI of the brain without contrast

INDICATION

Hx of stroke
LEFT SIDED WEAKNESS, LEFT EYE VISION CHANGES 3-4 WEEKS, LEFT LEG SWELLING, HX STROKE 10 YEARS AGO
WITH LEFT SIDED WEAKNESS.  RG

TECHNIQUE

Sagittal axial coronal images were obtained with variable T1 and T2 weighting.

COMPARISONS

None available

FINDINGS

No abnormal signal properties are noted involving the brainstem or visualized cervical cord.
Prominent pannus formation is noted at C1-2.

The expected signal voids are noted throughout the major intracranial vascular structures. Trace
fluid is noted in the mastoid air cells with minimal mucosal thickening of the paranasal sinuses.

No intracranial mass or hemorrhage is seen. Minimal cortical atrophy is seen with small vessel
ischemic change throughout the supratentorial white matter. Small right subinsular lacunar infarct
is noted image 14 series 8.

No diffusion restriction is seen.

IMPRESSION

Minimal cortical atrophy and mild small vessel ischemic disease. No intracranial mass hemorrhage is
seen. There is a small chronic right subinsular lacunar infarct. No acute ischemia is evident.

Tech Notes:

LEFT SIDED WEAKNESS, LEFT EYE VISION CHANGES 3-4 WEEKS, LEFT LEG SWELLING, HX STROKE 10 YEARS AGO
WITH LEFT SIDED WEAKNESS.  RG

## 2022-06-04 IMAGING — MR SPLUMBWO
12 of 15 series · 33 of 48 positions shown · non-contrast
Comparison: none

[Series 5: T2 · sagittal · 4.0mm · 0.68mm/px · 2 of 17 slices shown (1 of 7)]
[im 1/17]
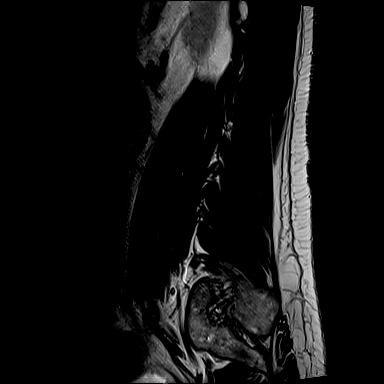
[im 17/17]
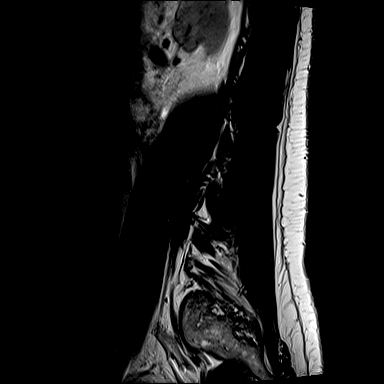

[Series 6: T1 · sagittal · 4.0mm · 0.81mm/px · 2 of 17 slices shown (1 of 4)]
[im 1/17]
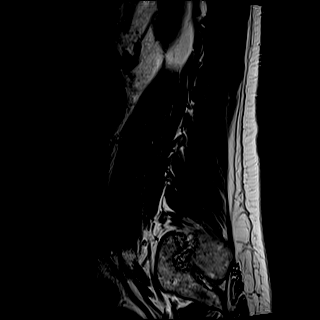
[im 17/17]
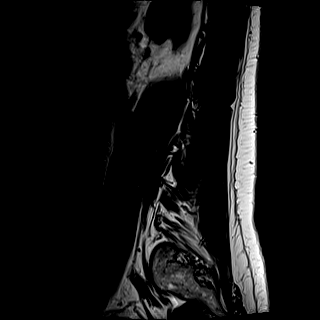

[Series 7: STIR · sagittal · 4.0mm · 0.51mm/px · 2 of 17 slices shown]
[im 1/17]
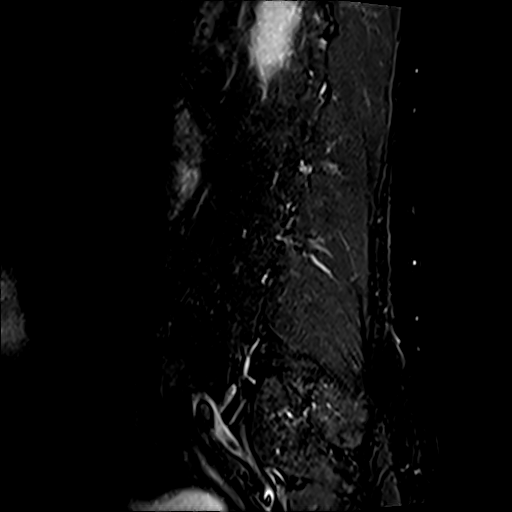
[im 17/17]
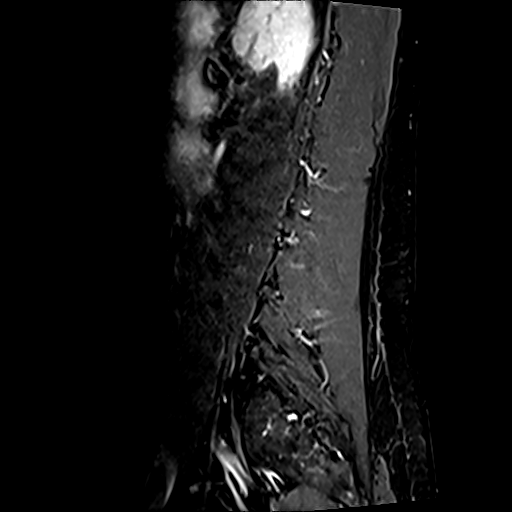

[Series 8: T2 · axial · 4.5mm · 0.81mm/px · z∈[-17,+148]mm · 4 of 32 slices shown (2 of 7)]
[im 1/32]
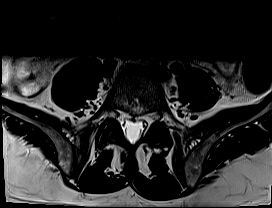
[im 11/32]
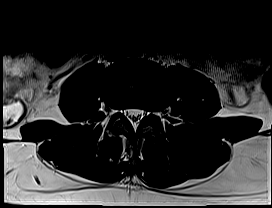
[im 21/32]
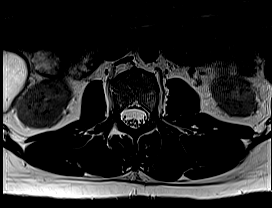
[im 32/32]
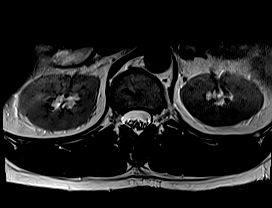

[Series 9: T2 · axial · 4.5mm · 0.81mm/px · 1 of 6 slices shown (3 of 7)]
[im 1/6]
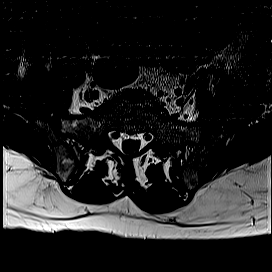

[Series 10: T2 · axial · 4.5mm · 0.81mm/px · z∈[-105,+148]mm · 4 of 36 slices shown (4 of 7)]
[im 1/36]
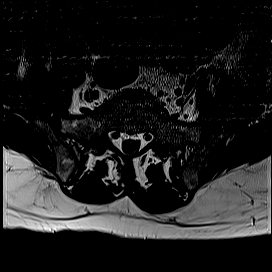
[im 12/36]
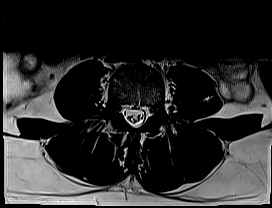
[im 24/36]
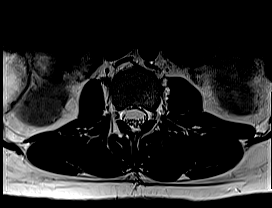
[im 36/36]
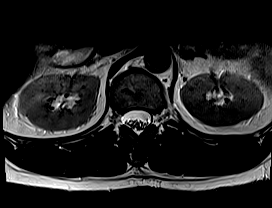

[Series 11: T1 · axial · 4.5mm · 0.43mm/px · z∈[-17,+148]mm · 4 of 32 slices shown (2 of 4)]
[im 1/32]
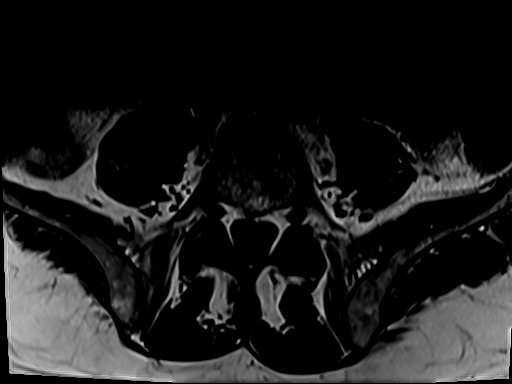
[im 11/32]
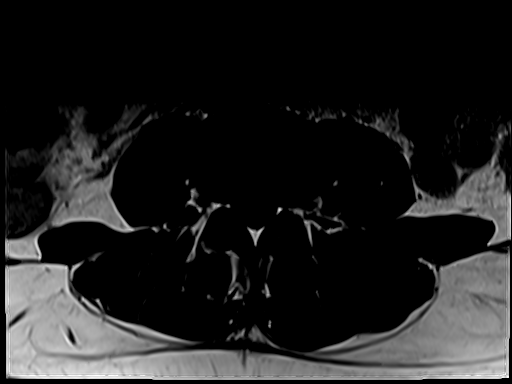
[im 21/32]
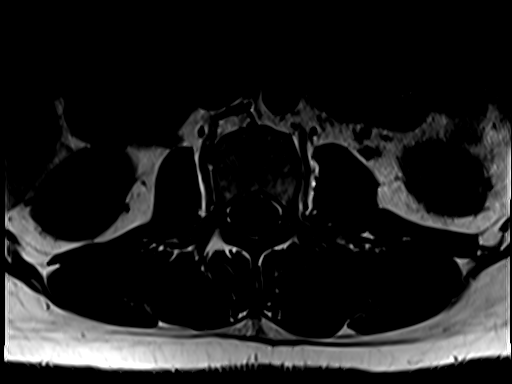
[im 32/32]
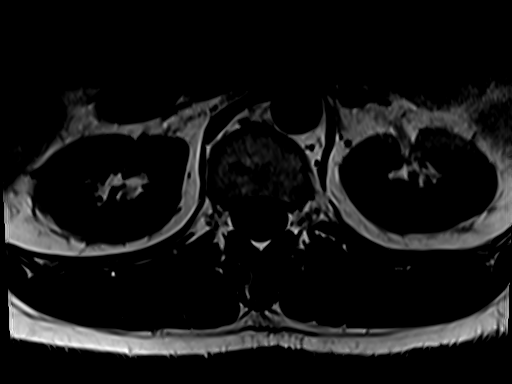

[Series 12: T1 · axial · 4.5mm · 0.43mm/px · 1 of 6 slices shown (3 of 4)]
[im 1/6]
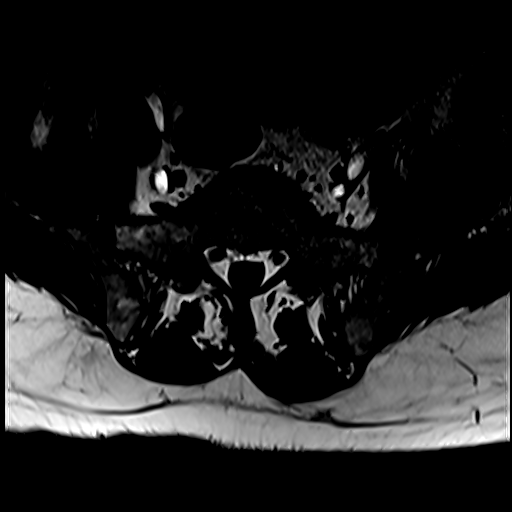

[Series 13: T1 · axial · 4.5mm · 0.43mm/px · z∈[-105,+148]mm · 4 of 36 slices shown (4 of 4)]
[im 1/36]
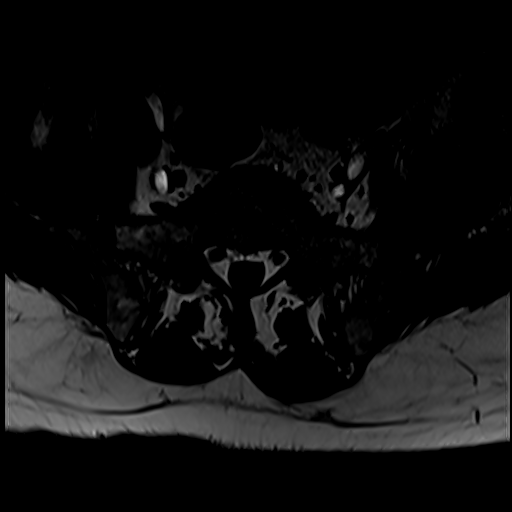
[im 12/36]
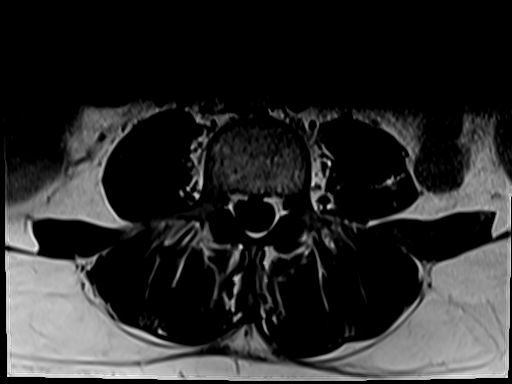
[im 24/36]
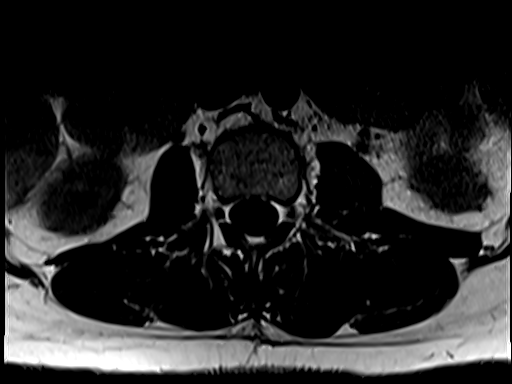
[im 36/36]
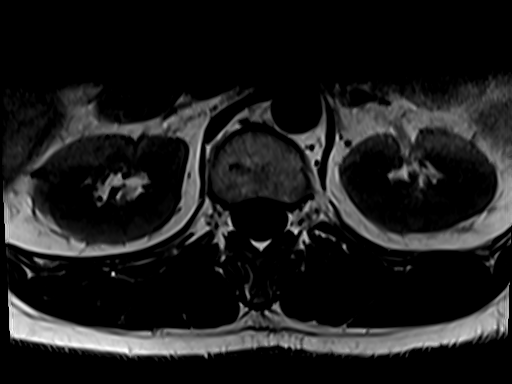

[Series 14: T2 · axial · 4.5mm · 0.81mm/px · z∈[-17,+148]mm · 4 of 32 slices shown (5 of 7)]
[im 1/32]
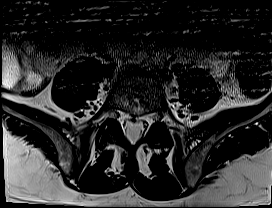
[im 11/32]
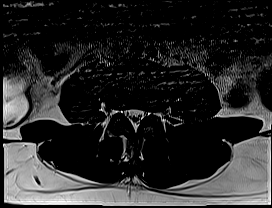
[im 21/32]
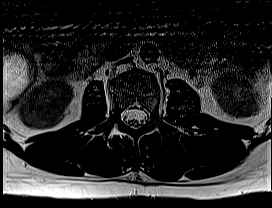
[im 32/32]
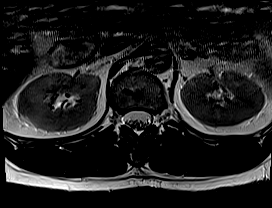

[Series 15: T2 · axial · 4.5mm · 0.81mm/px · 1 of 6 slices shown (6 of 7)]
[im 1/6]
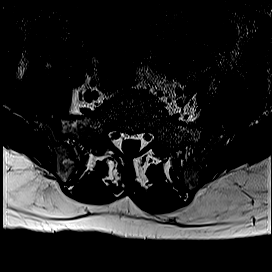

[Series 16: T2 · axial · 4.5mm · 0.81mm/px · z∈[-105,+148]mm · 4 of 36 slices shown (7 of 7)]
[im 1/36]
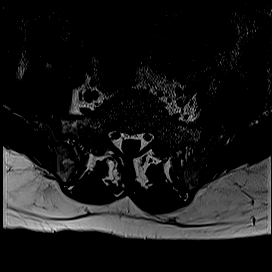
[im 12/36]
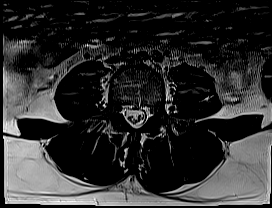
[im 24/36]
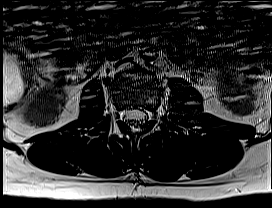
[im 36/36]
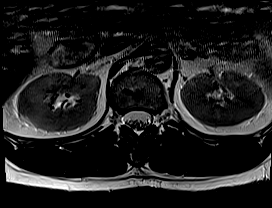

[33 of 48 positions shown; findings below may reference images not displayed]

DIAGNOSTIC STUDIES

EXAM

MRI lumbar spine without contrast

INDICATION

Hx of stroke
LEFT SIDED WEAKNESS, LEFT EYE VISION CHANGES 3-4 WEEKS, LEFT LEG SWELLING, HX STROKE 10 YEARS AGO
WITH LEFT SIDED WEAKNESS.  RG

TECHNIQUE

Sagittal axial images were obtained with variable T1 and T2 weighting.

COMPARISONS

None available

FINDINGS

There are no plain films. Patient is assumed to 5 lumbar type vertebral bodies.

Minimal disc bulging is seen at T12-L1. Conus medullaris is normal in its visualized aspects.

L1-2: The disc is normal at this level without central canal or neural foraminal stenosis.

L2-3: Disc bulging and facet hypertrophy is noted. There is minimal central canal narrowing. Neural
foramina well preserved. Mild edema is noted within the anterior superior aspect of L3 with anterior
osteophytic spurring consistent with degenerative change.

L3-4: Minimal disc bulging and facet hypertrophy is noted resulting in minimal bilateral neural
foraminal stenosis.

L4-5: Disc bulging and facet hypertrophy is seen. There is minimal left neural foraminal stenosis.

L5-S1: Disc bulging is noted. Facet hypertrophy is evident without significant central canal or
neural foraminal stenosis.

IMPRESSION

Mild degenerative changes throughout the lumbar spine. Please see above discussion for individual
levels.

Tech Notes:

LEFT SIDED WEAKNESS, LEFT EYE VISION CHANGES 3-4 WEEKS, LEFT LEG SWELLING, HX STROKE 10 YEARS AGO
WITH LEFT SIDED WEAKNESS.  RG

## 2022-06-17 IMAGING — CT LOW_DOSE_LUNG(Adult)
2 of 9 series · 11 of 36 positions shown, 14 images · non-contrast
Comparison: none

[Series 10: lung cor 1.00 br44 s3 · coronal · 0.69mm/px · 1 of 292 slices shown]
[im 146/292  lung]
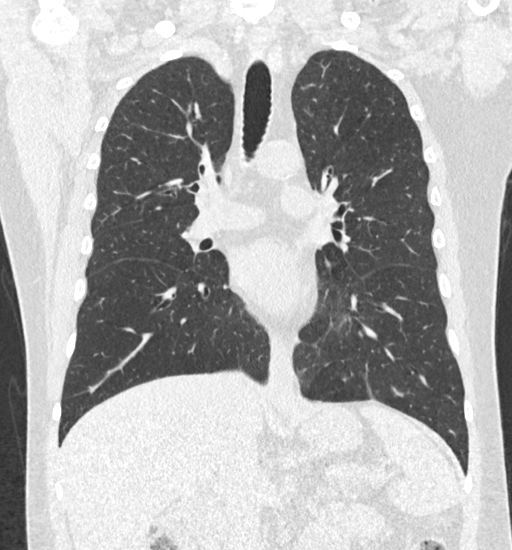

[Series 17: lung 1.00 br60 s3 cad · axial · 0.69mm/px · z∈[+1497,+1807]mm · 10 of 543 slices shown, 13 images]
[im 50/543  mediastinal]
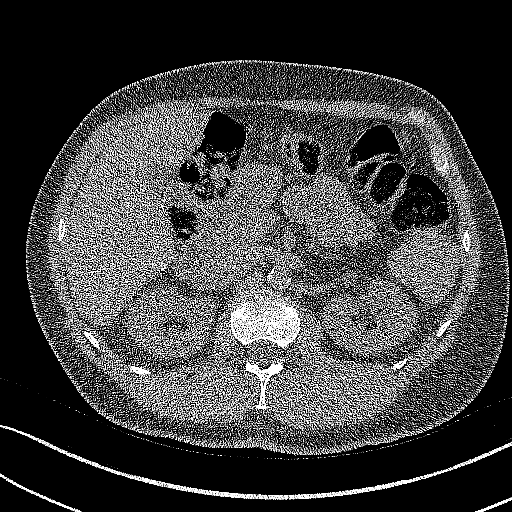
[im 50/543  lung]
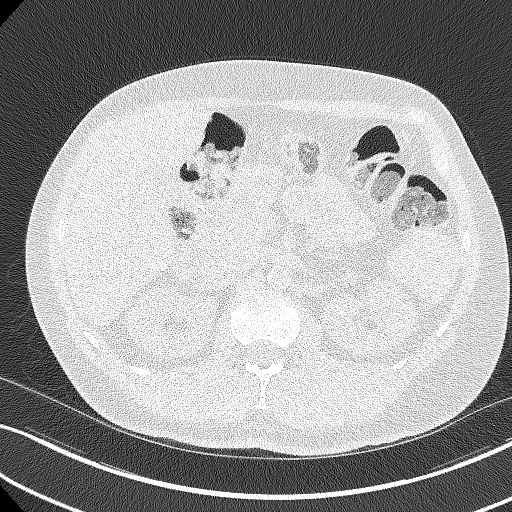
[im 99/543  lung]
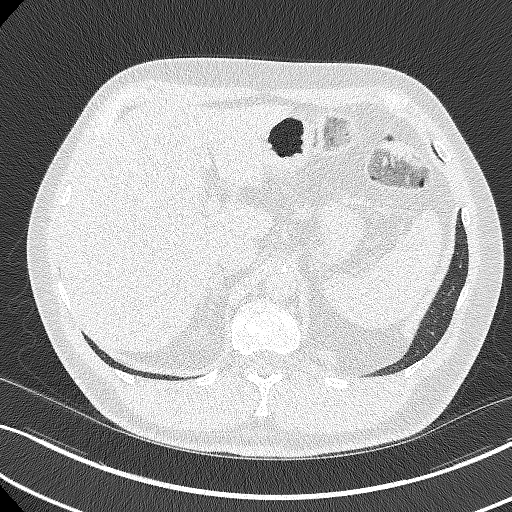
[im 148/543  lung]
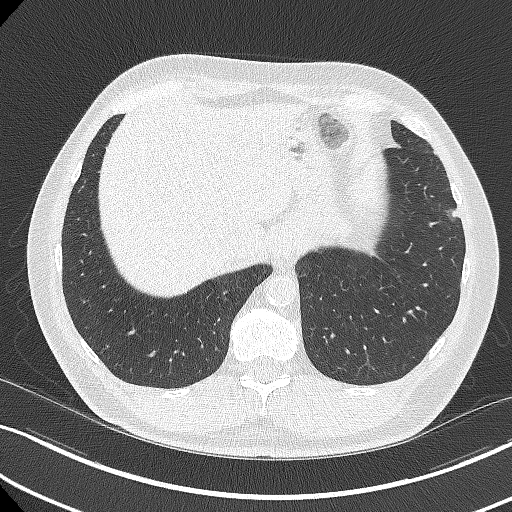
[im 198/543  lung]
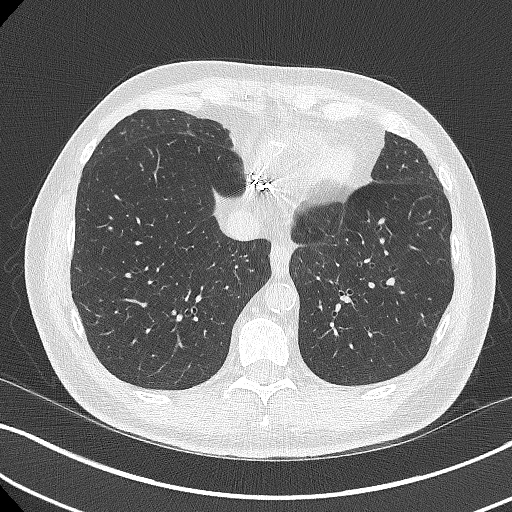
[im 247/543  mediastinal]
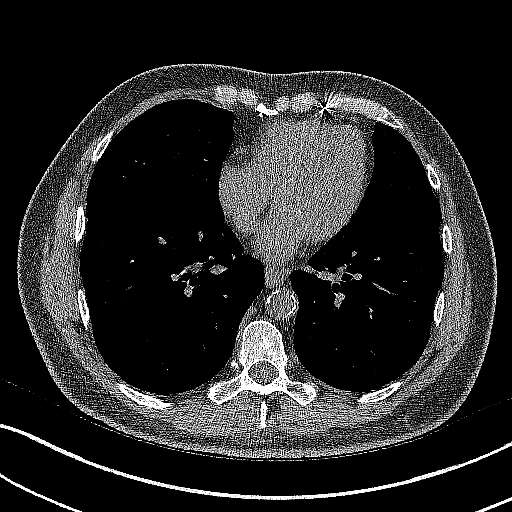
[im 247/543  lung]
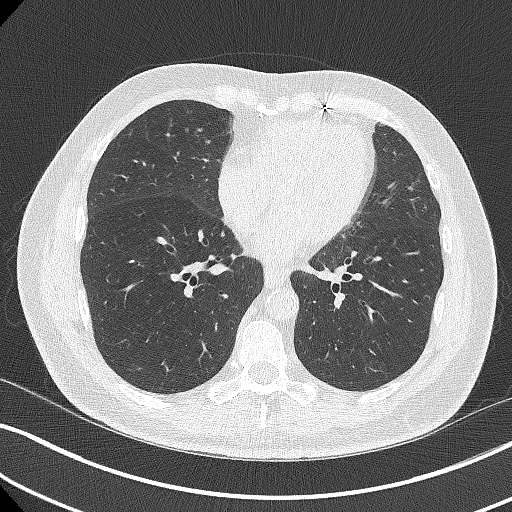
[im 296/543  lung]
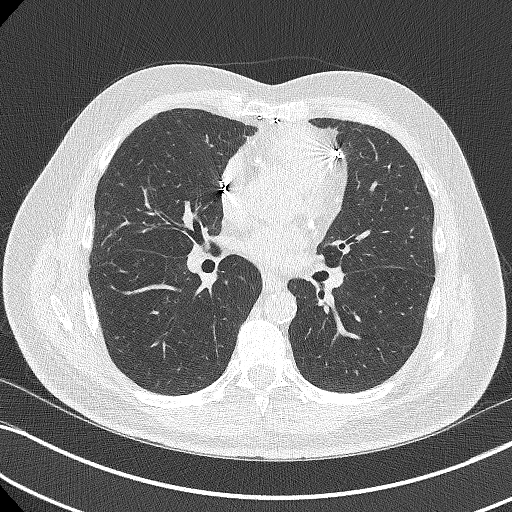
[im 345/543  lung]
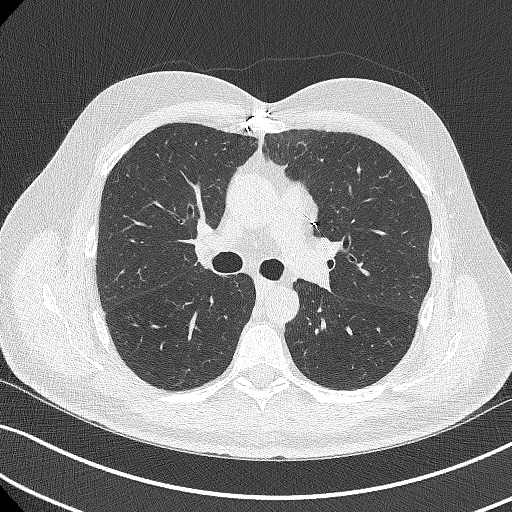
[im 395/543  lung]
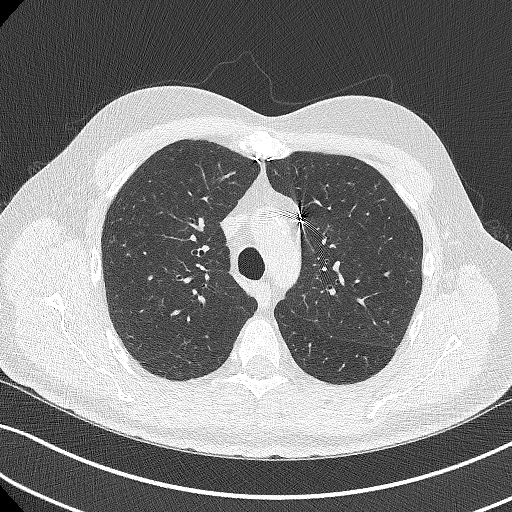
[im 444/543  mediastinal]
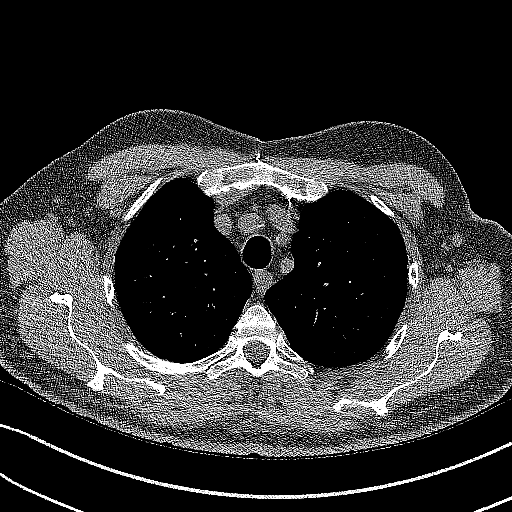
[im 444/543  lung]
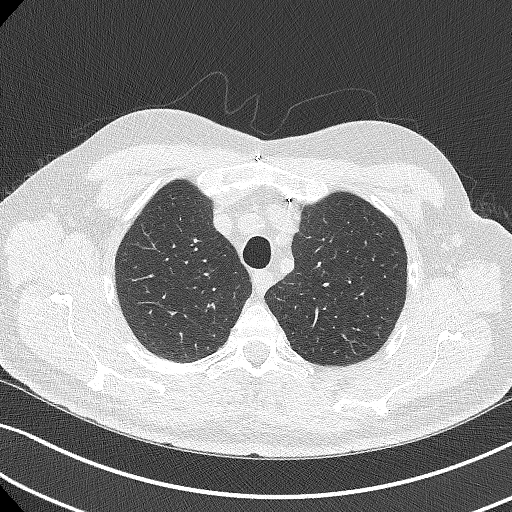
[im 493/543  lung]
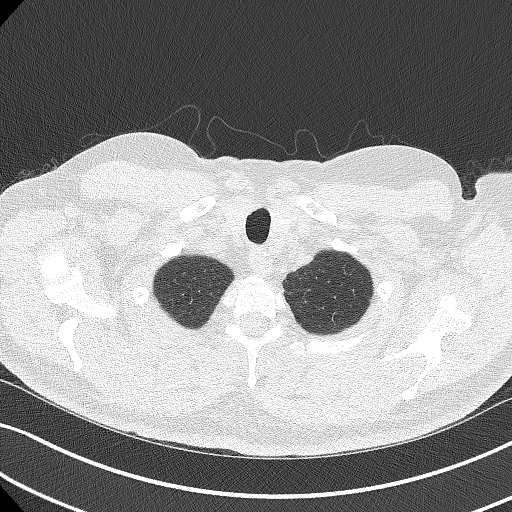

[11 of 36 positions shown; findings below may reference images not displayed]

EXAM

CT CHEST

INDICATION

Wellness examination

TECHNIQUE

CT of the chest was performed. No intravenous contrast was administered.

All CT scans at this facility use dose modulation, iterative reconstruction, and/or weight based
dosing when appropriate to reduce radiation dose to as low as reasonably achievable.

# of CT scans in the past year: 0 # of Myocardial perfusion scans this past year: 0

COMPARISONS

None available at the time of dictation.

FINDINGS

Lower neck: [Unremarkable]

Lymphadenopathy: [Multiple non-pathologically enlarged mediastinal lymph nodes.]

Cardiovascular: [Normal cardiac size.] [No pericardial effusion.] [Extensive coronary artery
calcifications. Evidence of prior sternotomy.] [Normal caliber of the great vessels.]

Upper abdomen: [Unremarkable]

Musculoskeletal: [No acute fracture or aggressive focal osseous lesion.] [Multilevel degenerative
change of the visualized spine.]

Lung parenchyma/pleural space: No suspicious focal airspace opacity, nodule, or consolidation. [No
pleural effusion or pneumothorax.]

Central Airways: Patent

IMPRESSION
1. Lung rads 1-negative.
2. CITATION: Lung cancer screening categorization and recommendations per [HOSPITAL] Lung-RADS version 1.0 ([URL]

Tech Notes:

PT STATES FORMER SMOKER X 8 YEARS. HX OF SMOKING 1 PACK PER DAY X 35 YEARS. CT/NM 0/0. TJ
